# Patient Record
Sex: Male | Born: 1950 | Race: White | Hispanic: No | Marital: Married | Smoking: Former smoker
Health system: Southern US, Community
[De-identification: ages and names within clinical notes are randomized; demographics above are authoritative.]

## PROBLEM LIST (undated history)

## (undated) DIAGNOSIS — M1990 Unspecified osteoarthritis, unspecified site: Secondary | ICD-10-CM

## (undated) DIAGNOSIS — E039 Hypothyroidism, unspecified: Secondary | ICD-10-CM

## (undated) DIAGNOSIS — R600 Localized edema: Secondary | ICD-10-CM

## (undated) DIAGNOSIS — G4733 Obstructive sleep apnea (adult) (pediatric): Secondary | ICD-10-CM

## (undated) DIAGNOSIS — Z9989 Dependence on other enabling machines and devices: Secondary | ICD-10-CM

## (undated) HISTORY — DX: Hypothyroidism, unspecified: E03.9

## (undated) HISTORY — DX: Obstructive sleep apnea (adult) (pediatric): G47.33

## (undated) HISTORY — DX: Dependence on other enabling machines and devices: Z99.89

## (undated) HISTORY — PX: HERNIA REPAIR: SHX51

## (undated) HISTORY — DX: Morbid (severe) obesity due to excess calories: E66.01

## (undated) HISTORY — PX: COLONOSCOPY: SHX174

## (undated) HISTORY — PX: ROTATOR CUFF REPAIR: SHX139

---

## 2003-11-07 ENCOUNTER — Encounter: Admission: RE | Admit: 2003-11-07 | Discharge: 2003-11-07 | Payer: Self-pay | Admitting: *Deleted

## 2004-08-21 ENCOUNTER — Ambulatory Visit (HOSPITAL_COMMUNITY): Admission: RE | Admit: 2004-08-21 | Discharge: 2004-08-21 | Payer: Self-pay | Admitting: Orthopedic Surgery

## 2004-08-21 ENCOUNTER — Ambulatory Visit (HOSPITAL_BASED_OUTPATIENT_CLINIC_OR_DEPARTMENT_OTHER): Admission: RE | Admit: 2004-08-21 | Discharge: 2004-08-21 | Payer: Self-pay | Admitting: Orthopedic Surgery

## 2005-06-26 ENCOUNTER — Encounter: Admission: RE | Admit: 2005-06-26 | Discharge: 2005-07-02 | Payer: Self-pay | Admitting: Family Medicine

## 2007-01-18 ENCOUNTER — Ambulatory Visit: Payer: Self-pay | Admitting: Internal Medicine

## 2010-08-05 ENCOUNTER — Ambulatory Visit (HOSPITAL_COMMUNITY): Admission: RE | Admit: 2010-08-05 | Discharge: 2010-08-05 | Payer: Self-pay | Admitting: Surgery

## 2010-08-06 ENCOUNTER — Ambulatory Visit (HOSPITAL_COMMUNITY): Admission: RE | Admit: 2010-08-06 | Discharge: 2010-08-06 | Payer: Self-pay | Admitting: Surgery

## 2010-08-26 ENCOUNTER — Encounter
Admission: RE | Admit: 2010-08-26 | Discharge: 2010-11-24 | Payer: Self-pay | Source: Home / Self Care | Admitting: Surgery

## 2010-11-03 ENCOUNTER — Ambulatory Visit (HOSPITAL_COMMUNITY): Admission: RE | Admit: 2010-11-03 | Discharge: 2010-11-03 | Payer: Self-pay | Admitting: Surgery

## 2010-12-02 ENCOUNTER — Inpatient Hospital Stay (HOSPITAL_COMMUNITY)
Admission: RE | Admit: 2010-12-02 | Discharge: 2010-12-05 | Payer: Self-pay | Source: Home / Self Care | Attending: Surgery | Admitting: Surgery

## 2010-12-03 ENCOUNTER — Encounter (INDEPENDENT_AMBULATORY_CARE_PROVIDER_SITE_OTHER): Payer: Self-pay | Admitting: Surgery

## 2010-12-09 ENCOUNTER — Encounter
Admission: RE | Admit: 2010-12-09 | Discharge: 2011-01-20 | Payer: Self-pay | Source: Home / Self Care | Attending: Surgery | Admitting: Surgery

## 2010-12-21 HISTORY — PX: GASTRIC BYPASS: SHX52

## 2011-01-26 ENCOUNTER — Encounter: Payer: Medicare PPO | Attending: Surgery | Admitting: *Deleted

## 2011-01-26 ENCOUNTER — Encounter: Admit: 2011-01-26 | Payer: Self-pay | Admitting: Surgery

## 2011-01-26 DIAGNOSIS — Z713 Dietary counseling and surveillance: Secondary | ICD-10-CM | POA: Insufficient documentation

## 2011-01-26 DIAGNOSIS — Z9884 Bariatric surgery status: Secondary | ICD-10-CM | POA: Insufficient documentation

## 2011-01-26 DIAGNOSIS — Z09 Encounter for follow-up examination after completed treatment for conditions other than malignant neoplasm: Secondary | ICD-10-CM | POA: Insufficient documentation

## 2011-03-02 LAB — CBC
HCT: 36.6 % — ABNORMAL LOW (ref 39.0–52.0)
HCT: 38.4 % — ABNORMAL LOW (ref 39.0–52.0)
Hemoglobin: 12.5 g/dL — ABNORMAL LOW (ref 13.0–17.0)
Hemoglobin: 13 g/dL (ref 13.0–17.0)
MCH: 33 pg (ref 26.0–34.0)
MCH: 33.1 pg (ref 26.0–34.0)
MCHC: 33.9 g/dL (ref 30.0–36.0)
MCHC: 34.2 g/dL (ref 30.0–36.0)
MCV: 96.8 fL (ref 78.0–100.0)
MCV: 97.5 fL (ref 78.0–100.0)
Platelets: 193 10*3/uL (ref 150–400)
Platelets: 198 10*3/uL (ref 150–400)
RBC: 3.78 MIL/uL — ABNORMAL LOW (ref 4.22–5.81)
RBC: 3.94 MIL/uL — ABNORMAL LOW (ref 4.22–5.81)
RDW: 13.3 % (ref 11.5–15.5)
RDW: 13.5 % (ref 11.5–15.5)
WBC: 13.1 10*3/uL — ABNORMAL HIGH (ref 4.0–10.5)
WBC: 8.3 10*3/uL (ref 4.0–10.5)

## 2011-03-02 LAB — DIFFERENTIAL
Basophils Absolute: 0 10*3/uL (ref 0.0–0.1)
Basophils Absolute: 0 10*3/uL (ref 0.0–0.1)
Basophils Relative: 0 % (ref 0–1)
Basophils Relative: 0 % (ref 0–1)
Eosinophils Absolute: 0.1 10*3/uL (ref 0.0–0.7)
Eosinophils Absolute: 0.3 10*3/uL (ref 0.0–0.7)
Eosinophils Relative: 0 % (ref 0–5)
Eosinophils Relative: 4 % (ref 0–5)
Lymphocytes Relative: 11 % — ABNORMAL LOW (ref 12–46)
Lymphocytes Relative: 21 % (ref 12–46)
Lymphs Abs: 1.4 10*3/uL (ref 0.7–4.0)
Lymphs Abs: 1.7 10*3/uL (ref 0.7–4.0)
Monocytes Absolute: 1 10*3/uL (ref 0.1–1.0)
Monocytes Absolute: 1.1 10*3/uL — ABNORMAL HIGH (ref 0.1–1.0)
Monocytes Relative: 12 % (ref 3–12)
Monocytes Relative: 8 % (ref 3–12)
Neutro Abs: 10.6 10*3/uL — ABNORMAL HIGH (ref 1.7–7.7)
Neutro Abs: 5.2 10*3/uL (ref 1.7–7.7)
Neutrophils Relative %: 63 % (ref 43–77)
Neutrophils Relative %: 81 % — ABNORMAL HIGH (ref 43–77)

## 2011-03-03 LAB — COMPREHENSIVE METABOLIC PANEL
ALT: 30 U/L (ref 0–53)
AST: 28 U/L (ref 0–37)
Albumin: 3.6 g/dL (ref 3.5–5.2)
Alkaline Phosphatase: 68 U/L (ref 39–117)
BUN: 13 mg/dL (ref 6–23)
CO2: 26 mEq/L (ref 19–32)
Calcium: 9.2 mg/dL (ref 8.4–10.5)
Chloride: 104 mEq/L (ref 96–112)
Creatinine, Ser: 0.8 mg/dL (ref 0.4–1.5)
GFR calc Af Amer: >60 mL/min (ref >60)
GFR calc non Af Amer: >60 mL/min (ref >60)
Glucose, Bld: 79 mg/dL (ref 70–99)
Potassium: 4.3 mEq/L (ref 3.5–5.1)
Sodium: 138 mEq/L (ref 135–145)
Total Bilirubin: 0.8 mg/dL (ref 0.3–1.2)
Total Protein: 6.7 g/dL (ref 6.0–8.3)

## 2011-03-03 LAB — DIFFERENTIAL
Basophils Absolute: 0 10*3/uL (ref 0.0–0.1)
Basophils Absolute: 0 10*3/uL (ref 0.0–0.1)
Basophils Relative: 0 % (ref 0–1)
Basophils Relative: 0 % (ref 0–1)
Eosinophils Absolute: 0 10*3/uL (ref 0.0–0.7)
Eosinophils Absolute: 0.2 10*3/uL (ref 0.0–0.7)
Eosinophils Relative: 0 % (ref 0–5)
Eosinophils Relative: 3 % (ref 0–5)
Lymphocytes Relative: 22 % (ref 12–46)
Lymphocytes Relative: 5 % — ABNORMAL LOW (ref 12–46)
Lymphs Abs: 0.6 10*3/uL — ABNORMAL LOW (ref 0.7–4.0)
Lymphs Abs: 1.7 10*3/uL (ref 0.7–4.0)
Monocytes Absolute: 0.6 10*3/uL (ref 0.1–1.0)
Monocytes Absolute: 0.6 10*3/uL (ref 0.1–1.0)
Monocytes Relative: 5 % (ref 3–12)
Monocytes Relative: 7 % (ref 3–12)
Neutro Abs: 10.5 10*3/uL — ABNORMAL HIGH (ref 1.7–7.7)
Neutro Abs: 5.2 10*3/uL (ref 1.7–7.7)
Neutrophils Relative %: 67 % (ref 43–77)
Neutrophils Relative %: 90 % — ABNORMAL HIGH (ref 43–77)

## 2011-03-03 LAB — CBC
HCT: 37.9 % — ABNORMAL LOW (ref 39.0–52.0)
HCT: 39.4 % (ref 39.0–52.0)
Hemoglobin: 13.3 g/dL (ref 13.0–17.0)
Hemoglobin: 13.7 g/dL (ref 13.0–17.0)
MCH: 32.8 pg (ref 26.0–34.0)
MCH: 33.3 pg (ref 26.0–34.0)
MCHC: 34.8 g/dL (ref 30.0–36.0)
MCHC: 35.1 g/dL (ref 30.0–36.0)
MCV: 93.6 fL (ref 78.0–100.0)
MCV: 95.9 fL (ref 78.0–100.0)
Platelets: 253 10*3/uL (ref 150–400)
Platelets: 259 10*3/uL (ref 150–400)
RBC: 4.05 MIL/uL — ABNORMAL LOW (ref 4.22–5.81)
RBC: 4.11 MIL/uL — ABNORMAL LOW (ref 4.22–5.81)
RDW: 13 % (ref 11.5–15.5)
RDW: 13 % (ref 11.5–15.5)
WBC: 11.6 10*3/uL — ABNORMAL HIGH (ref 4.0–10.5)
WBC: 7.8 10*3/uL (ref 4.0–10.5)

## 2011-03-03 LAB — HEMOGLOBIN AND HEMATOCRIT, BLOOD
HCT: 41 % (ref 39.0–52.0)
HCT: 41.2 % (ref 39.0–52.0)
Hemoglobin: 14.2 g/dL (ref 13.0–17.0)
Hemoglobin: 14.2 g/dL (ref 13.0–17.0)

## 2011-03-03 LAB — SURGICAL PCR SCREEN
MRSA, PCR: NEGATIVE
Staphylococcus aureus: POSITIVE — AB

## 2011-03-16 ENCOUNTER — Encounter: Payer: Medicare PPO | Attending: Surgery | Admitting: *Deleted

## 2011-03-16 DIAGNOSIS — Z9884 Bariatric surgery status: Secondary | ICD-10-CM | POA: Insufficient documentation

## 2011-03-16 DIAGNOSIS — Z713 Dietary counseling and surveillance: Secondary | ICD-10-CM | POA: Insufficient documentation

## 2011-03-16 DIAGNOSIS — Z09 Encounter for follow-up examination after completed treatment for conditions other than malignant neoplasm: Secondary | ICD-10-CM | POA: Insufficient documentation

## 2011-05-08 NOTE — Assessment & Plan Note (Signed)
[facility]                         GASTROENTEROLOGY OFFICE NOTE   NAME:Tony Ho, Tony Ho                      MRN:          [id number]  DATE:01/18/2007                            DOB:          [birth year]    CHIEF COMPLAINT:  Bloating, gas, pain.  Need colonoscopy.   ASSESSMENT:  A 60 year old white man who needs a screening colonoscopy.  He has over the past couple of years developed some periumbilical and  infraumbilical abdominal pain that is vague and mild, associated with  gaseousness, which is generally relieved by flatus.  There is no change  in bowel habits.   PLAN:  1. Proceed to colonoscopy.  2. He was asking about obesity procedures.  I explained that we did      not do that here, but suggested that he call [facility]      Surgery, as he does have morbid obesity, and he might be a      candidate for surgical interventions to treat this problem.   HISTORY:  A 60 year old white man with problems as outlined above.  No  bowel habit changes or rectal bleeding noted.  He recently had a  physical with Dr. Christell Constant.   Family history is negative for colon cancer.  He tells me his Hemoccults  were negative recently.   PAST MEDICAL HISTORY:  1. Morbid obesity.  2. Hypertension.  3. Hypothyroidism.  4. Right and left knee surgery.  5. Right shoulder surgery.   FAMILY HISTORY:  Positive for diabetes and heart disease in his father.   MEDICATIONS:  1. Levoxyl 200 mcg daily.  2. Torsemide 20 mg 3 tabs daily.  3. HCTZ 25 mg daily.   ALLERGIES:  None known.   SOCIAL HISTORY:  He is married.  He is a Chartered certified accountant.  Lives with his  wife.  Three sons.  No alcohol, tobacco, or drugs.   REVIEW OF SYSTEMS:  Eye glasses, pedal edema.  All other systems are  negative.   PHYSICAL EXAMINATION:  VITAL SIGNS:  Height 5 feet 9.  Weight 341  pounds.  Blood pressure 104/60, pulse 78.  GENERAL:  This is a morbidly obese white male in no acute  distress.  HEENT:  Eyes anicteric.  LUNGS:  Clear.  HEART:  S1 and S2.  No murmurs, rubs or gallops.  ABDOMEN:  Obese.  PSYCH:  He is alert and oriented x3.   CBC recently normal on December 08, 2006 with hemoglobin 14.7,  hematocrit 42.  Total cholesterol 204, LFTs normal.  Triglycerides 182,  HDL 39, LDL 129.  Glucose 103.  Renal function normal.  TSH was mildly  up at 9.895.   He actually had 25 mcg added to his dose, so he is really on 225 mcg of  levothyroxine.   I appreciate the opportunity to care for this patient.     Iva Boop, MD,FACG  Electronically Signed    CEG/MedQ  DD: 01/18/2007  DT: 01/18/2007  Job #: [id number]   cc:   Ernestina Penna, M.D.

## 2011-06-01 ENCOUNTER — Encounter (INDEPENDENT_AMBULATORY_CARE_PROVIDER_SITE_OTHER): Payer: Self-pay | Admitting: Surgery

## 2011-06-08 ENCOUNTER — Ambulatory Visit: Payer: Medicare PPO | Admitting: *Deleted

## 2011-06-11 ENCOUNTER — Encounter (INDEPENDENT_AMBULATORY_CARE_PROVIDER_SITE_OTHER): Payer: Self-pay | Admitting: Surgery

## 2011-10-16 ENCOUNTER — Other Ambulatory Visit: Payer: Self-pay | Admitting: Neurology

## 2011-10-16 DIAGNOSIS — R413 Other amnesia: Secondary | ICD-10-CM

## 2011-10-16 DIAGNOSIS — R269 Unspecified abnormalities of gait and mobility: Secondary | ICD-10-CM

## 2011-10-26 ENCOUNTER — Ambulatory Visit
Admission: RE | Admit: 2011-10-26 | Discharge: 2011-10-26 | Disposition: A | Discharge status: Home / Self Care | Payer: Medicare PPO | Source: Ambulatory Visit | Attending: Neurology | Admitting: Neurology

## 2011-10-26 DIAGNOSIS — R413 Other amnesia: Secondary | ICD-10-CM

## 2011-10-26 DIAGNOSIS — R269 Unspecified abnormalities of gait and mobility: Secondary | ICD-10-CM

## 2012-01-23 IMAGING — RF DG UGI W/ GASTROGRAFIN
12 of 21 series · 12 of 21 positions shown · IV contrast (agent unspecified)
Comparison: None

CLINICAL DATA: Postop gastric bypass.

WATER SOLUBLE UPPER GI SERIES
TECHNIQUE: Single-column upper GI series was performed using water
soluble contrast.
Fluoroscopy Time: 1.7 minutes.
Contrast: 20 ml 6mnipaque-KQQ.

[Series 1: run · 1 of 1 slices shown (1 of 12)]
[im 1/1]
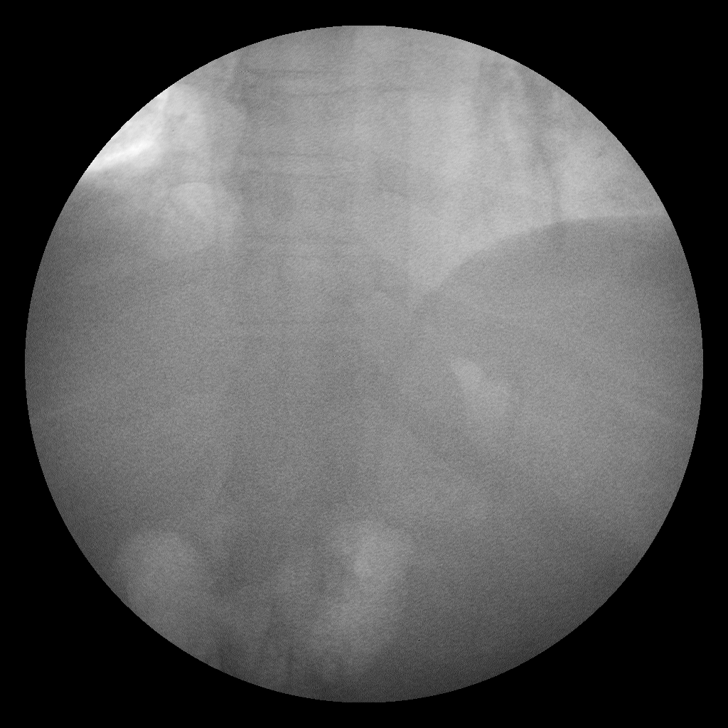

[Series 3: run · 1 of 1 slices shown (2 of 12)]
[im 1/1]
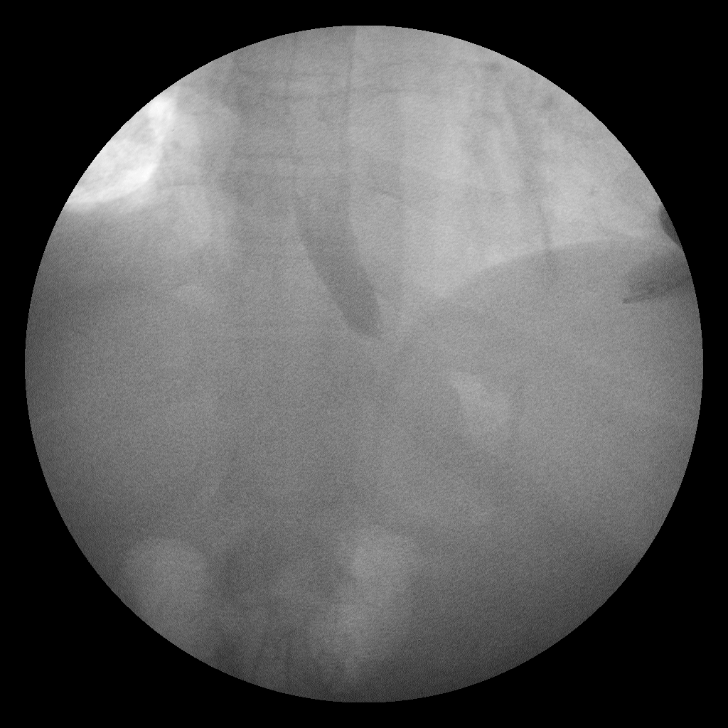

[Series 5: run · 1 of 1 slices shown (3 of 12)]
[im 1/1]
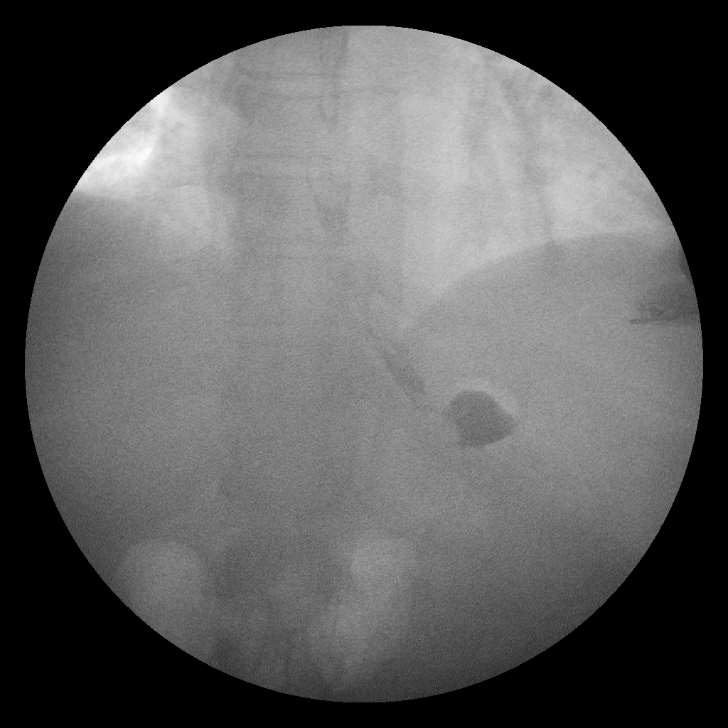

[Series 7: run · 1 of 1 slices shown (4 of 12)]
[im 1/1]
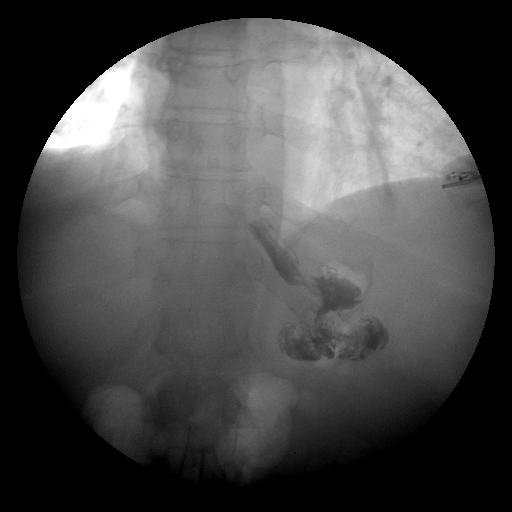

[Series 8: run · 1 of 1 slices shown (5 of 12)]
[im 1/1]
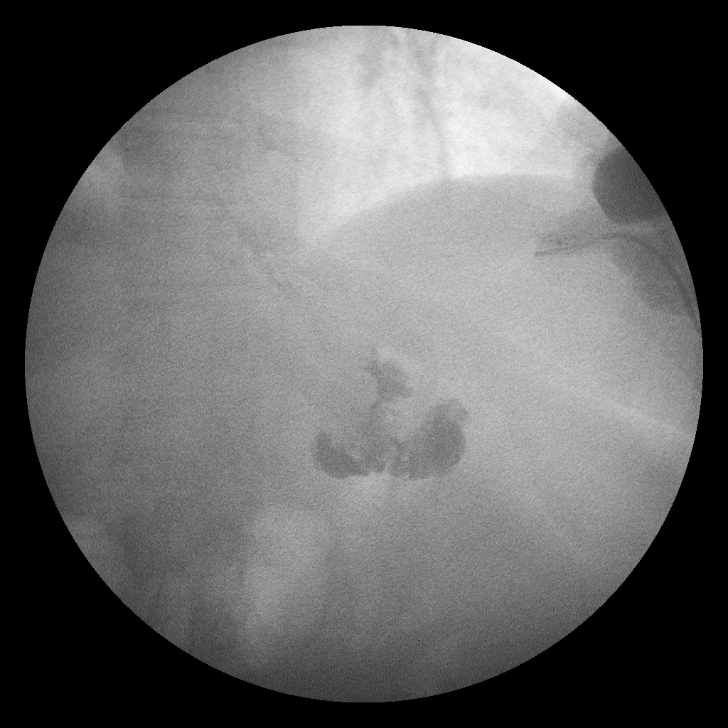

[Series 10: run · 1 of 1 slices shown (6 of 12)]
[im 1/1]
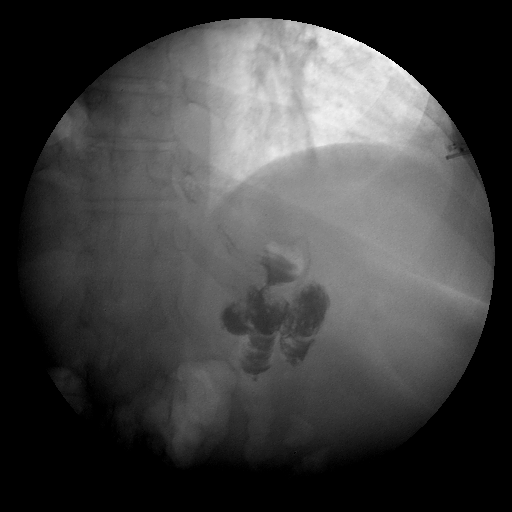

[Series 12: run · 1 of 1 slices shown (7 of 12)]
[im 1/1]
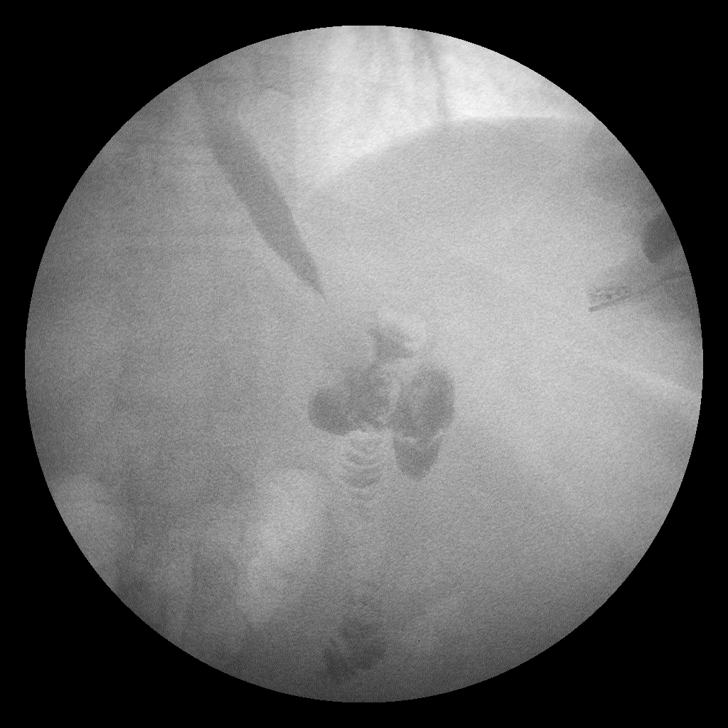

[Series 14: run · 1 of 1 slices shown (8 of 12)]
[im 1/1]
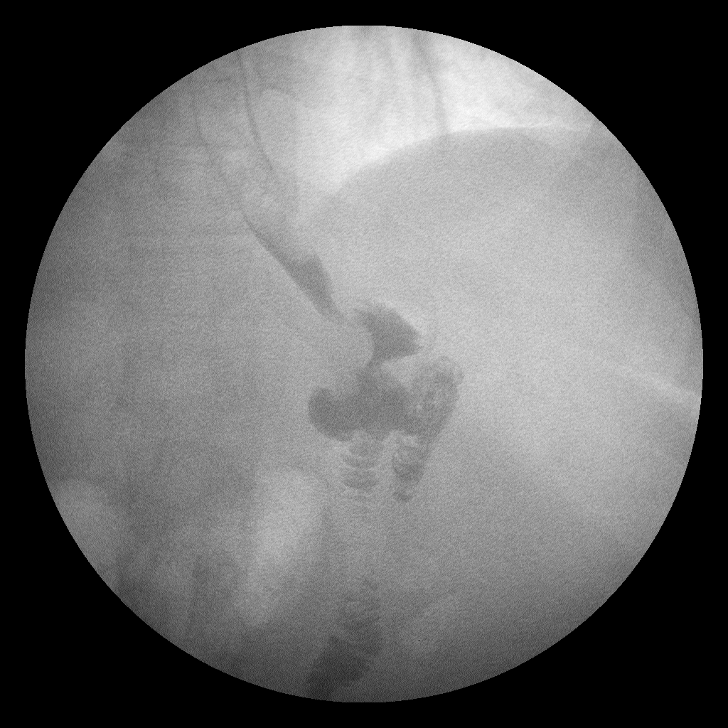

[Series 15: run · 1 of 1 slices shown (9 of 12)]
[im 1/1]
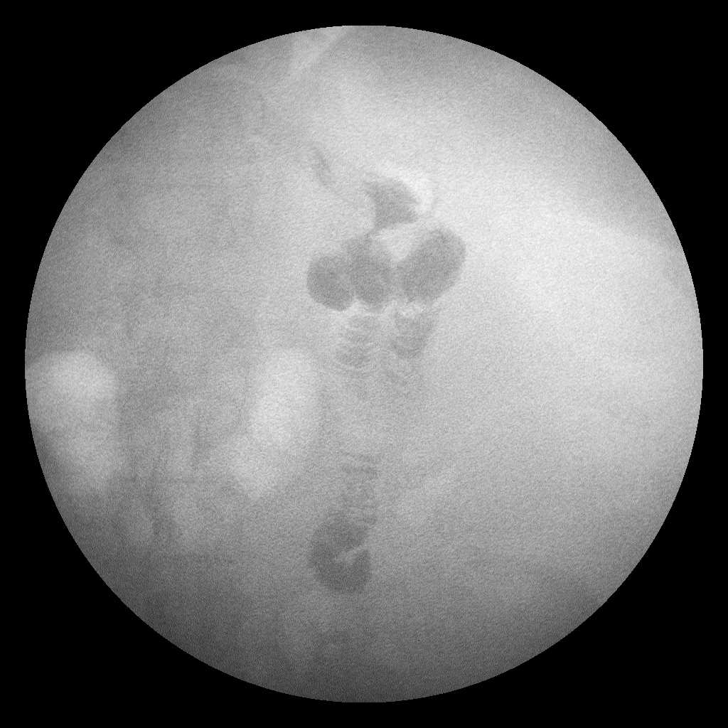

[Series 17: run · 1 of 1 slices shown (10 of 12)]
[im 1/1]
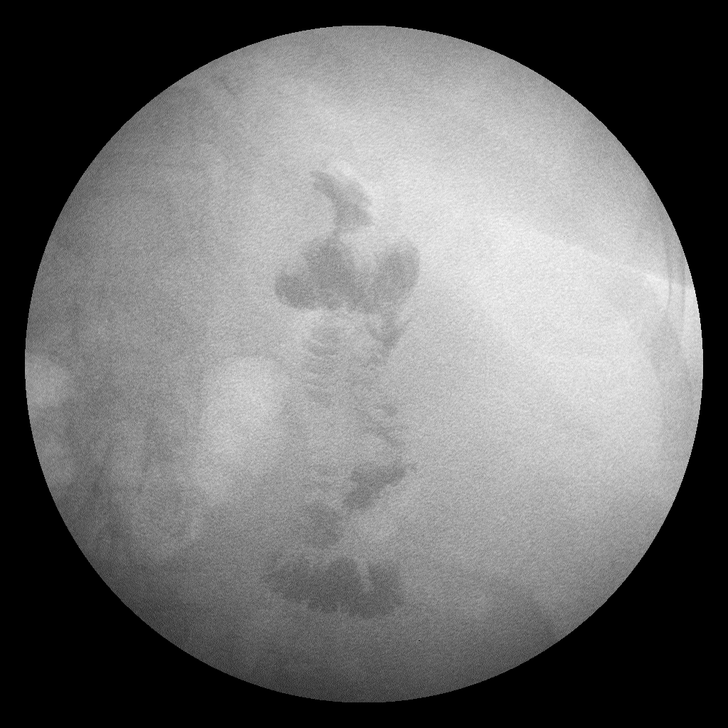

[Series 19: run · 1 of 1 slices shown (11 of 12)]
[im 1/1]
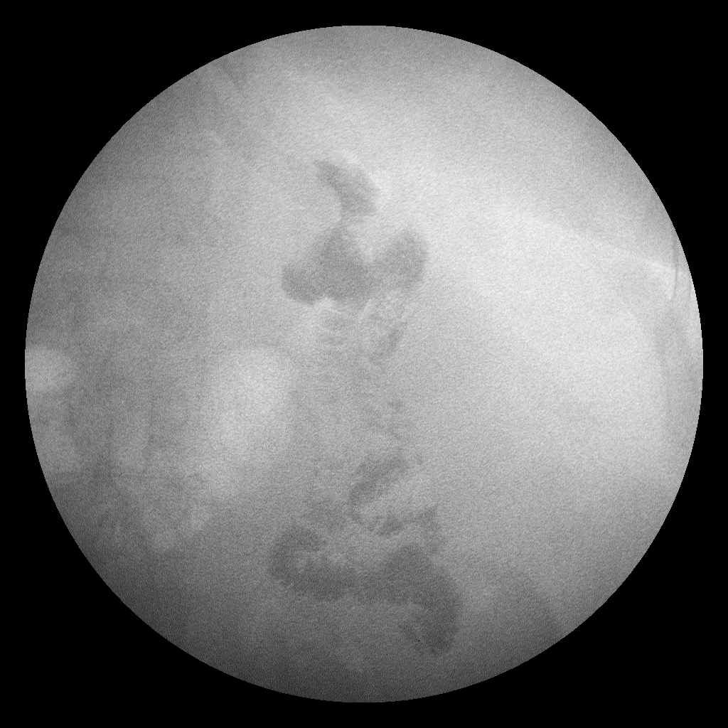

[Series 21: run · 1 of 1 slices shown (12 of 12)]
[im 1/1]
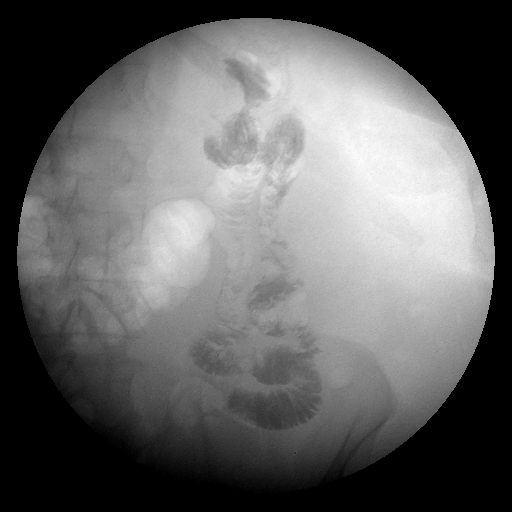

[12 of 21 positions shown; findings below may reference images not displayed]

FINDINGS: Scout film of the abdomen demonstrates mild gaseous
distention of the transverse colon, likely mild ileus.

Ingestion of 20 ml 6mnipaque-KQQ demonstrates normal appearance of
the distal esophagus.  Small gastric pouch is present which freely
passes into the jejunum.  The jejunum is normal caliber.  No
evidence of leak.  Normal passage of contrast through the distal
anastomosis.
IMPRESSION: Expected postop gastric bypass appearance.  No evidence of leak or
obstruction.

## 2012-09-12 ENCOUNTER — Telehealth (INDEPENDENT_AMBULATORY_CARE_PROVIDER_SITE_OTHER): Payer: Self-pay | Admitting: Surgery

## 2012-09-12 NOTE — Telephone Encounter (Signed)
I called the patient to schedule his annual follow-up appointment post gastric bypass surgery. There was no answer at the home phone nor an answering machine to leave a message on..Tony Ho

## 2014-04-25 ENCOUNTER — Telehealth (HOSPITAL_COMMUNITY): Payer: Self-pay

## 2014-04-25 NOTE — Telephone Encounter (Signed)
This patient is overdue for recommended follow-up with a bariatric surgeon at [facility]. Call attempted today to reestablish post-op care with [facility], but unable to reach patient by phone.  A letter will be mailed to the patient today to the address on file from [facility] advising the patient on the benefits of follow-up care and directing them to call [facility] at [phone] to schedule an appointment at their earliest convenience.  ° °Amanda T. Fleming °Bariatric Office Coordinator °[phone] ° °

## 2014-07-04 ENCOUNTER — Encounter: Payer: Self-pay | Admitting: *Deleted

## 2014-07-30 ENCOUNTER — Encounter (INDEPENDENT_AMBULATORY_CARE_PROVIDER_SITE_OTHER): Payer: Self-pay | Admitting: Surgery

## 2014-07-30 ENCOUNTER — Ambulatory Visit (INDEPENDENT_AMBULATORY_CARE_PROVIDER_SITE_OTHER): Payer: Medicare PPO | Admitting: Surgery

## 2014-07-30 VITALS — BP 144/84 | HR 72 | Temp 97.2°F | Ht 67.0 in | Wt 220.0 lb

## 2014-07-30 DIAGNOSIS — L723 Sebaceous cyst: Secondary | ICD-10-CM

## 2014-07-30 DIAGNOSIS — D170 Benign lipomatous neoplasm of skin and subcutaneous tissue of head, face and neck: Secondary | ICD-10-CM | POA: Insufficient documentation

## 2014-07-30 NOTE — Progress Notes (Signed)
Patient ID: Tony Ho, male   DOB: [birth year], 63 y.o.   MRN: [id number]  Chief Complaint  Patient presents with  . Cyst    forehead    HPI Tony Ho is a 63 y.o. male.  Self-referred for forehead mass  HPI This is a 63 year old male Who is status post gastric bypass in 2012 with significant weight loss who presents with an enlarging protruding mass on his for head. He has noticed this for over a year. It seems to be slightly larger. He also has a small palpable subcutaneous mass on his right cheek. He would like to have both of these removed as these occasionally become uncomfortable or, in the case of the cheek mass, caused difficulty with shaving. Past Medical History  Diagnosis Date  . Hypothyroidism   . Morbid obesity   . OSA on CPAP     Past Surgical History  Procedure Laterality Date  . Gastric bypass  2012    Family History  Problem Relation Age of Onset  . Diabetes Mother   . Heart disease Mother     Social History History  Substance Use Topics  . Smoking status: Former Research scientist (life sciences)  . Smokeless tobacco: Never Used     Comment: quit in 1990's  . Alcohol Use: No    No Known Allergies  Current Outpatient Prescriptions  Medication Sig Dispense Refill  . aspirin 325 MG tablet Take 325 mg by mouth daily.      . furosemide (LASIX) 80 MG tablet Take 80 mg by mouth.      . levothyroxine (SYNTHROID, LEVOTHROID) 137 MCG tablet Take 137 mcg by mouth daily before breakfast.      . methocarbamol (ROBAXIN) 500 MG tablet Take 500 mg by mouth 4 (four) times daily.      . temazepam (RESTORIL) 15 MG capsule Take 15 mg by mouth at bedtime as needed for sleep.      . traMADol (ULTRAM) 50 MG tablet Take by mouth every 6 (six) hours as needed.       No current facility-administered medications for this visit.    Review of Systems Review of Systems  Constitutional: Negative for fever, chills and unexpected weight change.  HENT: Negative for congestion, hearing loss, sore  throat, trouble swallowing and voice change.   Eyes: Negative for visual disturbance.  Respiratory: Negative for cough and wheezing.   Cardiovascular: Negative for chest pain, palpitations and leg swelling.  Gastrointestinal: Negative for nausea, vomiting, abdominal pain, diarrhea, constipation, blood in stool, abdominal distention, anal bleeding and rectal pain.  Genitourinary: Negative for hematuria and difficulty urinating.  Musculoskeletal: Negative for arthralgias.  Skin: Negative for rash and wound.  Neurological: Negative for seizures, syncope, weakness and headaches.  Hematological: Negative for adenopathy. Does not bruise/bleed easily.  Psychiatric/Behavioral: Negative for confusion.    Blood pressure 144/84, pulse 72, temperature 97.2 F (36.2 C), height 5\' 7"  (1.702 m), weight 220 lb (99.791 kg).  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  The forehead lipoma will likely cause significant bleeding that would need to be controlled with electrocautery.  The surgical procedure has been discussed with the patient.  Potential risks, benefits, alternative treatments, and expected outcomes have been explained.  All of the patient's questions at this time have been answered.  The likelihood of reaching the patient's treatment goal is good.  The patient understand the proposed surgical procedure and wishes to proceed.         Marco Adelson K. 07/30/2014, 1:20 PM

## 2014-08-10 ENCOUNTER — Encounter (HOSPITAL_COMMUNITY): Payer: Self-pay | Admitting: Pharmacy Technician

## 2014-08-15 ENCOUNTER — Encounter (HOSPITAL_COMMUNITY): Payer: Self-pay

## 2014-08-15 ENCOUNTER — Encounter (HOSPITAL_COMMUNITY)
Admission: RE | Admit: 2014-08-15 | Discharge: 2014-08-15 | Disposition: A | Discharge status: Home / Self Care | Payer: Medicare PPO | Source: Ambulatory Visit | Attending: Surgery | Admitting: Surgery

## 2014-08-15 DIAGNOSIS — E039 Hypothyroidism, unspecified: Secondary | ICD-10-CM | POA: Diagnosis not present

## 2014-08-15 DIAGNOSIS — Z6836 Body mass index (BMI) 36.0-36.9, adult: Secondary | ICD-10-CM | POA: Diagnosis not present

## 2014-08-15 DIAGNOSIS — D2330 Other benign neoplasm of skin of unspecified part of face: Secondary | ICD-10-CM | POA: Diagnosis not present

## 2014-08-15 DIAGNOSIS — D170 Benign lipomatous neoplasm of skin and subcutaneous tissue of head, face and neck: Secondary | ICD-10-CM | POA: Diagnosis not present

## 2014-08-15 DIAGNOSIS — Z9884 Bariatric surgery status: Secondary | ICD-10-CM | POA: Diagnosis not present

## 2014-08-15 DIAGNOSIS — Z87891 Personal history of nicotine dependence: Secondary | ICD-10-CM | POA: Diagnosis not present

## 2014-08-15 DIAGNOSIS — Z7982 Long term (current) use of aspirin: Secondary | ICD-10-CM | POA: Diagnosis not present

## 2014-08-15 DIAGNOSIS — G4733 Obstructive sleep apnea (adult) (pediatric): Secondary | ICD-10-CM | POA: Diagnosis not present

## 2014-08-15 HISTORY — DX: Localized edema: R60.0

## 2014-08-15 HISTORY — DX: Unspecified osteoarthritis, unspecified site: M19.90

## 2014-08-15 LAB — BASIC METABOLIC PANEL
Anion gap: 12 (ref 5–15)
BUN: 11 mg/dL (ref 6–23)
CALCIUM: 8.8 mg/dL (ref 8.4–10.5)
CO2: 27 mEq/L (ref 19–32)
Chloride: 103 mEq/L (ref 96–112)
Creatinine, Ser: 0.73 mg/dL (ref 0.50–1.35)
GFR calc Af Amer: >90 mL/min (ref >90)
GFR calc non Af Amer: >90 mL/min (ref >90)
GLUCOSE: 89 mg/dL (ref 70–99)
POTASSIUM: 4 meq/L (ref 3.7–5.3)
Sodium: 142 mEq/L (ref 137–147)

## 2014-08-15 LAB — CBC
HEMATOCRIT: 38.8 % — AB (ref 39.0–52.0)
HEMOGLOBIN: 13.5 g/dL (ref 13.0–17.0)
MCH: 32.7 pg (ref 26.0–34.0)
MCHC: 34.8 g/dL (ref 30.0–36.0)
MCV: 93.9 fL (ref 78.0–100.0)
Platelets: 208 10*3/uL (ref 150–400)
RBC: 4.13 MIL/uL — ABNORMAL LOW (ref 4.22–5.81)
RDW: 12.4 % (ref 11.5–15.5)
WBC: 5.9 10*3/uL (ref 4.0–10.5)

## 2014-08-15 MED ORDER — CEFAZOLIN SODIUM-DEXTROSE 2-3 GM-% IV SOLR
2.0000 g | INTRAVENOUS | Status: AC
  Administered 2014-08-16: 2 g via INTRAVENOUS
  Filled 2014-08-15: qty 50

## 2014-08-15 NOTE — Progress Notes (Signed)
Pt denies SOB, chest pain, and being under the care of a cardiologist. Pt denies having a cardiac cath but not sure if he had an EKG, echo and stress test; results requested from all treating physicians. Pt denies having a chest x ray within the last year. Pt stated that he takes Lasix for B/LLE edema.

## 2014-08-15 NOTE — Pre-Procedure Instructions (Addendum)
Tony Ho  08/15/2014   Your procedure is scheduled on: Thursday, August 16, 2014   Report to [facility] Admitting at 9:00 AM.  Call this number if you have problems the morning of surgery: [phone]   Remember:   Do not eat food or drink liquids after midnight.   Take these medicines the morning of surgery with A SIP OF WATER: levothyroxine (SYNTHROID), If needed: traMADol (ULTRAM) for pain Stop taking Aspirin, Multivitamins, and herbal medications. Do not take any NSAIDs ie: Ibuprofen, Advil, Naproxen or any medication containing Aspirin.  Do not wear jewelry, make-up or nail polish.  Do not wear lotions, powders, or perfumes. You may wear deodorant.  Do not shave 48 hours prior to surgery. Men may shave face and neck.  Do not bring valuables to the hospital.  [facility] is not responsible for any belongings or valuables.               Contacts, dentures or bridgework may not be worn into surgery.  Leave suitcase in the car. After surgery it may be brought to your room.  For patients admitted to the hospital, discharge time is determined by your treatment team.               Patients discharged the day of surgery will not be allowed to drive home.  Name and phone number of your driver:   Special Instructions:  Special Instructions:Special Instructions: [facility] - Preparing for Surgery  Before surgery, you can play an important role.  Because skin is not sterile, your skin needs to be as free of germs as possible.  You can reduce the number of germs on you skin by washing with CHG (chlorahexidine gluconate) soap before surgery.  CHG is an antiseptic cleaner which kills germs and bonds with the skin to continue killing germs even after washing.  Please DO NOT use if you have an allergy to CHG or antibacterial soaps.  If your skin becomes reddened/irritated stop using the CHG and inform your nurse when you arrive at Short Stay.  Do not shave (including legs and  underarms) for at least 48 hours prior to the first CHG shower.  You may shave your face.  Please follow these instructions carefully:   1.  Shower with CHG Soap the night before surgery and the morning of Surgery.  2.  If you choose to wash your hair, wash your hair first as usual with your normal shampoo.  3.  After you shampoo, rinse your hair and body thoroughly to remove the Shampoo.  4.  Use CHG as you would any other liquid soap.  You can apply chg directly  to the skin and wash gently with scrungie or a clean washcloth.  5.  Apply the CHG Soap to your body ONLY FROM THE NECK DOWN.  Do not use on open wounds or open sores.  Avoid contact with your eyes, ears, mouth and genitals (private parts).  Wash genitals (private parts) with your normal soap.  6.  Wash thoroughly, paying special attention to the area where your surgery will be performed.  7.  Thoroughly rinse your body with warm water from the neck down.  8.  DO NOT shower/wash with your normal soap after using and rinsing off the CHG Soap.  9.  Pat yourself dry with a clean towel.            10.  Wear clean pajamas.  11.  Place clean sheets on your bed the night of your first shower and do not sleep with pets.  Day of Surgery  Do not apply any lotions the morning of surgery.  Please wear clean clothes to the hospital/surgery center.   Please read over the following fact sheets that you were given: Pain Booklet, Coughing and Deep Breathing and Surgical Site Infection Prevention

## 2014-08-16 ENCOUNTER — Encounter (HOSPITAL_COMMUNITY): Payer: Medicare PPO | Admitting: Anesthesiology

## 2014-08-16 ENCOUNTER — Ambulatory Visit (HOSPITAL_COMMUNITY): Payer: Medicare PPO | Admitting: Anesthesiology

## 2014-08-16 ENCOUNTER — Encounter (HOSPITAL_COMMUNITY)
Admission: RE | Disposition: A | Discharge status: Home / Self Care | Payer: Self-pay | Source: Ambulatory Visit | Attending: Surgery

## 2014-08-16 ENCOUNTER — Encounter (HOSPITAL_COMMUNITY): Payer: Self-pay | Admitting: Surgery

## 2014-08-16 ENCOUNTER — Ambulatory Visit (HOSPITAL_COMMUNITY)
Admission: RE | Admit: 2014-08-16 | Discharge: 2014-08-16 | Disposition: A | Discharge status: Home / Self Care | Payer: Medicare PPO | Source: Ambulatory Visit | Attending: Surgery | Admitting: Surgery

## 2014-08-16 DIAGNOSIS — G4733 Obstructive sleep apnea (adult) (pediatric): Secondary | ICD-10-CM | POA: Diagnosis not present

## 2014-08-16 DIAGNOSIS — D1739 Benign lipomatous neoplasm of skin and subcutaneous tissue of other sites: Secondary | ICD-10-CM

## 2014-08-16 DIAGNOSIS — E039 Hypothyroidism, unspecified: Secondary | ICD-10-CM | POA: Insufficient documentation

## 2014-08-16 DIAGNOSIS — D170 Benign lipomatous neoplasm of skin and subcutaneous tissue of head, face and neck: Secondary | ICD-10-CM

## 2014-08-16 DIAGNOSIS — D1039 Benign neoplasm of other parts of mouth: Secondary | ICD-10-CM

## 2014-08-16 DIAGNOSIS — Z9884 Bariatric surgery status: Secondary | ICD-10-CM | POA: Insufficient documentation

## 2014-08-16 DIAGNOSIS — D2330 Other benign neoplasm of skin of unspecified part of face: Secondary | ICD-10-CM | POA: Insufficient documentation

## 2014-08-16 DIAGNOSIS — L723 Sebaceous cyst: Secondary | ICD-10-CM

## 2014-08-16 DIAGNOSIS — Z6836 Body mass index (BMI) 36.0-36.9, adult: Secondary | ICD-10-CM | POA: Insufficient documentation

## 2014-08-16 DIAGNOSIS — Z7982 Long term (current) use of aspirin: Secondary | ICD-10-CM | POA: Insufficient documentation

## 2014-08-16 DIAGNOSIS — Z87891 Personal history of nicotine dependence: Secondary | ICD-10-CM | POA: Insufficient documentation

## 2014-08-16 HISTORY — PX: LIPOMA EXCISION: SHX5283

## 2014-08-16 SURGERY — EXCISION LIPOMA
Anesthesia: General | Site: Face

## 2014-08-16 MED ORDER — MIDAZOLAM HCL 2 MG/2ML IJ SOLN
INTRAMUSCULAR | Status: AC
  Filled 2014-08-16: qty 2

## 2014-08-16 MED ORDER — DEXAMETHASONE SODIUM PHOSPHATE 4 MG/ML IJ SOLN
INTRAMUSCULAR | Status: DC | PRN
  Administered 2014-08-16: 4 mg via INTRAVENOUS

## 2014-08-16 MED ORDER — ONDANSETRON HCL 4 MG/2ML IJ SOLN
INTRAMUSCULAR | Status: DC | PRN
Start: 2014-08-16 — End: 2014-08-16
  Administered 2014-08-16: 4 mg via INTRAVENOUS

## 2014-08-16 MED ORDER — FENTANYL CITRATE 0.05 MG/ML IJ SOLN
INTRAMUSCULAR | Status: AC
  Filled 2014-08-16: qty 5

## 2014-08-16 MED ORDER — PROPOFOL 10 MG/ML IV BOLUS
INTRAVENOUS | Status: DC | PRN
  Administered 2014-08-16: 150 mg via INTRAVENOUS

## 2014-08-16 MED ORDER — HYDROCODONE-ACETAMINOPHEN 5-325 MG PO TABS: 1.0000 | ORAL_TABLET | ORAL | Status: DC | PRN

## 2014-08-16 MED ORDER — CHLORHEXIDINE GLUCONATE 4 % EX LIQD
1.0000 "application " | Freq: Once | CUTANEOUS | Status: DC
  Filled 2014-08-16: qty 15

## 2014-08-16 MED ORDER — FENTANYL CITRATE 0.05 MG/ML IJ SOLN
INTRAMUSCULAR | Status: DC | PRN
  Administered 2014-08-16: 25 ug via INTRAVENOUS
  Administered 2014-08-16: 100 ug via INTRAVENOUS

## 2014-08-16 MED ORDER — LIDOCAINE HCL (CARDIAC) 20 MG/ML IV SOLN
INTRAVENOUS | Status: DC | PRN
  Administered 2014-08-16: 100 mg via INTRAVENOUS

## 2014-08-16 MED ORDER — PROPOFOL 10 MG/ML IV BOLUS
INTRAVENOUS | Status: AC
  Filled 2014-08-16: qty 20

## 2014-08-16 MED ORDER — GLYCOPYRROLATE 0.2 MG/ML IJ SOLN
INTRAMUSCULAR | Status: DC | PRN
  Administered 2014-08-16: 0.2 mg via INTRAVENOUS

## 2014-08-16 MED ORDER — MORPHINE SULFATE 2 MG/ML IJ SOLN: 2.0000 mg | INTRAMUSCULAR | Status: DC | PRN

## 2014-08-16 MED ORDER — ONDANSETRON HCL 4 MG/2ML IJ SOLN
INTRAMUSCULAR | Status: AC
Start: 2014-08-16 — End: 2014-08-16
  Filled 2014-08-16: qty 2

## 2014-08-16 MED ORDER — NEOSTIGMINE METHYLSULFATE 10 MG/10ML IV SOLN
INTRAVENOUS | Status: AC
  Filled 2014-08-16: qty 1

## 2014-08-16 MED ORDER — 0.9 % SODIUM CHLORIDE (POUR BTL) OPTIME
TOPICAL | Status: DC | PRN
  Administered 2014-08-16: 1000 mL

## 2014-08-16 MED ORDER — EPHEDRINE SULFATE 50 MG/ML IJ SOLN
INTRAMUSCULAR | Status: DC | PRN
  Administered 2014-08-16: 10 mg via INTRAVENOUS

## 2014-08-16 MED ORDER — GLYCOPYRROLATE 0.2 MG/ML IJ SOLN
INTRAMUSCULAR | Status: AC
  Filled 2014-08-16: qty 2

## 2014-08-16 MED ORDER — LACTATED RINGERS IV SOLN
INTRAVENOUS | Status: DC | PRN
  Administered 2014-08-16 (×2): via INTRAVENOUS

## 2014-08-16 MED ORDER — ARTIFICIAL TEARS OP OINT
TOPICAL_OINTMENT | OPHTHALMIC | Status: AC
  Filled 2014-08-16: qty 3.5

## 2014-08-16 MED ORDER — ONDANSETRON HCL 4 MG/2ML IJ SOLN: 4.0000 mg | INTRAMUSCULAR | Status: DC | PRN

## 2014-08-16 MED ORDER — BUPIVACAINE-EPINEPHRINE 0.25% -1:200000 IJ SOLN
INTRAMUSCULAR | Status: DC | PRN
  Administered 2014-08-16: 2 mL

## 2014-08-16 MED ORDER — ROCURONIUM BROMIDE 50 MG/5ML IV SOLN
INTRAVENOUS | Status: AC
  Filled 2014-08-16: qty 1

## 2014-08-16 MED ORDER — LACTATED RINGERS IV SOLN
INTRAVENOUS | Status: DC
  Administered 2014-08-16: 09:00:00 via INTRAVENOUS

## 2014-08-16 MED ORDER — MIDAZOLAM HCL 5 MG/5ML IJ SOLN
INTRAMUSCULAR | Status: DC | PRN
  Administered 2014-08-16: 1 mg via INTRAVENOUS

## 2014-08-16 SURGICAL SUPPLY — 53 items
ADH SKN CLS APL DERMABOND .7 (GAUZE/BANDAGES/DRESSINGS) ×1
APL SKNCLS STERI-STRIP NONHPOA (GAUZE/BANDAGES/DRESSINGS) ×1
BENZOIN TINCTURE PRP APPL 2/3 (GAUZE/BANDAGES/DRESSINGS) ×3 IMPLANT
BLADE SURG 15 STRL LF DISP TIS (BLADE) IMPLANT
BLADE SURG 15 STRL SS (BLADE) ×3
BLADE SURG ROTATE 9660 (MISCELLANEOUS) IMPLANT
CHLORAPREP W/TINT 26ML (MISCELLANEOUS) IMPLANT
CLEANER TIP ELECTROSURG 2X2 (MISCELLANEOUS) ×3 IMPLANT
CLOSURE WOUND 1/2 X4 (GAUZE/BANDAGES/DRESSINGS) ×1
COVER SURGICAL LIGHT HANDLE (MISCELLANEOUS) ×3 IMPLANT
DECANTER SPIKE VIAL GLASS SM (MISCELLANEOUS) IMPLANT
DERMABOND ADVANCED (GAUZE/BANDAGES/DRESSINGS) ×2
DERMABOND ADVANCED .7 DNX12 (GAUZE/BANDAGES/DRESSINGS) IMPLANT
DRAPE LAPAROTOMY TRNSV 102X78 (DRAPE) ×3 IMPLANT
DRAPE UTILITY 15X26 W/TAPE STR (DRAPE) ×6 IMPLANT
ELECT CAUTERY BLADE 6.4 (BLADE) ×2 IMPLANT
ELECT REM PT RETURN 9FT ADLT (ELECTROSURGICAL) ×3
ELECTRODE REM PT RTRN 9FT ADLT (ELECTROSURGICAL) ×1 IMPLANT
GAUZE SPONGE 4X4 12PLY STRL (GAUZE/BANDAGES/DRESSINGS) ×3 IMPLANT
GAUZE SPONGE 4X4 16PLY XRAY LF (GAUZE/BANDAGES/DRESSINGS) ×2 IMPLANT
GLOVE BIO SURGEON STRL SZ7 (GLOVE) ×5 IMPLANT
GLOVE BIOGEL PI IND STRL 6 (GLOVE) IMPLANT
GLOVE BIOGEL PI IND STRL 7.0 (GLOVE) IMPLANT
GLOVE BIOGEL PI IND STRL 7.5 (GLOVE) ×1 IMPLANT
GLOVE BIOGEL PI INDICATOR 6 (GLOVE) ×2
GLOVE BIOGEL PI INDICATOR 7.0 (GLOVE) ×4
GLOVE BIOGEL PI INDICATOR 7.5 (GLOVE) ×2
GLOVE SURG SS PI 7.0 STRL IVOR (GLOVE) ×2 IMPLANT
GOWN STRL REUS W/ TWL LRG LVL3 (GOWN DISPOSABLE) ×2 IMPLANT
GOWN STRL REUS W/TWL LRG LVL3 (GOWN DISPOSABLE) ×9
KIT BASIN OR (CUSTOM PROCEDURE TRAY) ×3 IMPLANT
KIT ROOM TURNOVER OR (KITS) ×3 IMPLANT
NDL HYPO 25GX1X1/2 BEV (NEEDLE) IMPLANT
NEEDLE HYPO 25GX1X1/2 BEV (NEEDLE) IMPLANT
NS IRRIG 1000ML POUR BTL (IV SOLUTION) ×3 IMPLANT
PACK SURGICAL SETUP 50X90 (CUSTOM PROCEDURE TRAY) ×3 IMPLANT
PAD ARMBOARD 7.5X6 YLW CONV (MISCELLANEOUS) ×6 IMPLANT
PENCIL BUTTON HOLSTER BLD 10FT (ELECTRODE) ×3 IMPLANT
SPECIMEN JAR SMALL (MISCELLANEOUS) IMPLANT
SPONGE LAP 18X18 X RAY DECT (DISPOSABLE) ×1 IMPLANT
STRIP CLOSURE SKIN 1/2X4 (GAUZE/BANDAGES/DRESSINGS) ×2 IMPLANT
SUT MNCRL AB 4-0 PS2 18 (SUTURE) ×1 IMPLANT
SUT MON AB 5-0 PS2 18 (SUTURE) ×4 IMPLANT
SUT VIC AB 2-0 SH 27 (SUTURE)
SUT VIC AB 2-0 SH 27X BRD (SUTURE) IMPLANT
SUT VIC AB 3-0 SH 27 (SUTURE)
SUT VIC AB 3-0 SH 27XBRD (SUTURE) ×1 IMPLANT
SUT VIC AB 4-0 PS2 27 (SUTURE) IMPLANT
SYR BULB 3OZ (MISCELLANEOUS) ×3 IMPLANT
SYR CONTROL 10ML LL (SYRINGE) ×4 IMPLANT
TOWEL OR 17X24 6PK STRL BLUE (TOWEL DISPOSABLE) ×3 IMPLANT
TOWEL OR 17X26 10 PK STRL BLUE (TOWEL DISPOSABLE) ×3 IMPLANT
WATER STERILE IRR 1000ML POUR (IV SOLUTION) ×3 IMPLANT

## 2014-08-16 NOTE — Transfer of Care (Signed)
Immediate Anesthesia Transfer of Care Note  Patient: Tony Ho  Procedure(s) Performed: Procedure(s): EXCISION OF FOREHEAD LIPOMA AND CYST RIGHT CHEEK (N/A)  Patient Location: PACU  Anesthesia Type:General  Level of Consciousness: awake, alert  and oriented  Airway & Oxygen Therapy: Patient Spontanous Breathing and Patient connected to face mask oxygen  Post-op Assessment: Report given to PACU RN  Post vital signs: Reviewed and stable  Complications: No apparent anesthesia complications

## 2014-08-16 NOTE — Anesthesia Preprocedure Evaluation (Addendum)
Anesthesia Evaluation  Patient identified by MRN, date of birth, ID band Patient awake    Reviewed: Allergy & Precautions, H&P , NPO status , Patient's Chart, lab work & pertinent test results  Airway Mallampati: I TM Distance: >3 FB Neck ROM: Full    Dental   Pulmonary sleep apnea , former smoker,          Cardiovascular     Neuro/Psych    GI/Hepatic   Endo/Other    Renal/GU      Musculoskeletal   Abdominal   Peds  Hematology   Anesthesia Other Findings   Reproductive/Obstetrics                          Anesthesia Physical Anesthesia Plan  ASA: II  Anesthesia Plan: General   Post-op Pain Management:    Induction: Intravenous  Airway Management Planned: LMA  Additional Equipment:   Intra-op Plan:   Post-operative Plan: Extubation in OR  Informed Consent: I have reviewed the patients History and Physical, chart, labs and discussed the procedure including the risks, benefits and alternatives for the proposed anesthesia with the patient or authorized representative who has indicated his/her understanding and acceptance.     Plan Discussed with: CRNA and Surgeon  Anesthesia Plan Comments:         Anesthesia Quick Evaluation

## 2014-08-16 NOTE — Discharge Instructions (Signed)
What to eat: ° °For your first meals, you should eat lightly; only small meals initially.  If you do not have nausea, you may eat larger meals.  Avoid spicy, greasy and heavy food.   ° °General Anesthesia, Adult, Care After  °Refer to this sheet in the next few weeks. These instructions provide you with information on caring for yourself after your procedure. Your health care provider may also give you more specific instructions. Your treatment has been planned according to current medical practices, but problems sometimes occur. Call your health care provider if you have any problems or questions after your procedure.  °WHAT TO EXPECT AFTER THE PROCEDURE  °After the procedure, it is typical to experience:  °Sleepiness.  °Nausea and vomiting. °HOME CARE INSTRUCTIONS  °For the first 24 hours after general anesthesia:  °Have a responsible person with you.  °Do not drive a car. If you are alone, do not take public transportation.  °Do not drink alcohol.  °Do not take medicine that has not been prescribed by your health care provider.  °Do not sign important papers or make important decisions.  °You may resume a normal diet and activities as directed by your health care provider.  °Change bandages (dressings) as directed.  °If you have questions or problems that seem related to general anesthesia, call the hospital and ask for the anesthetist or anesthesiologist on call. °SEEK MEDICAL CARE IF:  °You have nausea and vomiting that continue the day after anesthesia.  °You develop a rash. °SEEK IMMEDIATE MEDICAL CARE IF:  °You have difficulty breathing.  °You have chest pain.  °You have any allergic problems. °Document Released: 03/15/2001 Document Revised: 08/09/2013 Document Reviewed: 06/22/2013  °ExitCare® Patient Information ©2014 [employer].  ° °Tissue Adhesive Wound Care  ° ° °Some cuts and wounds can be closed with tissue adhesive. Adhesive is like glue. It holds the skin together and helps a wound heal faster.  This adhesive goes away on its own as the wound heals.  °HOME CARE  °Showers are allowed. Do not soak the wound in water. Do not take baths, swim, or use hot tubs. Do not use soaps or creams on your wound.  °If a bandage (dressing) was put on, change it as often as told by your doctor.  °Keep the bandage dry.  °Do not scratch, pick, or rub the adhesive.  °Do not put tape over the adhesive. The adhesive could come off.  °Protect the wound from another injury.  °Protect the wound from sun and tanning beds.  °Only take medicine as told by your doctor.  °Keep all doctor visits as told. °GET HELP RIGHT AWAY IF:  °Your wound is red, puffy (swollen), hot, or tender.  °You get a rash after the glue is put on.  °You have more pain in the wound.  °You have a red streak going away from the wound.  °You have yellowish-white fluid (pus) coming from the wound.  °You have more bleeding.  °You have a fever.  °You have chills and start to shake.  °You notice a bad smell coming from the wound.  °Your wound or adhesive breaks open. °MAKE SURE YOU:  °Understand these instructions.  °Will watch your condition.  °Will get help right away if you are not doing well or get worse. °Document Released: 09/15/2008 Document Revised: 09/27/2013 Document Reviewed: 06/28/2013  °ExitCare® Patient Information ©2015 [employer]. This information is not intended to replace advice given to you by your health care provider.   Make sure you discuss any questions you have with your health care provider.  ° ° °

## 2014-08-16 NOTE — H&P (View-Only) (Signed)
Patient ID: Tony Ho, male   DOB: 1951/03/24, 63 y.o.   MRN: [id number]  Chief Complaint  Patient presents with  . Cyst    forehead    HPI Tony Ho is a 63 y.o. male.  Self-referred for forehead mass  HPI This is a 63 year old male Who is status post gastric bypass in 2012 with significant weight loss who presents with an enlarging protruding mass on his for head. He has noticed this for over a year. It seems to be slightly larger. He also has a small palpable subcutaneous mass on his right cheek. He would like to have both of these removed as these occasionally become uncomfortable or, in the case of the cheek mass, caused difficulty with shaving. Past Medical History  Diagnosis Date  . Hypothyroidism   . Morbid obesity   . OSA on CPAP     Past Surgical History  Procedure Laterality Date  . Gastric bypass  2012    Family History  Problem Relation Age of Onset  . Diabetes Mother   . Heart disease Mother     Social History History  Substance Use Topics  . Smoking status: Former Research scientist (life sciences)  . Smokeless tobacco: Never Used     Comment: quit in 1990's  . Alcohol Use: No    No Known Allergies  Current Outpatient Prescriptions  Medication Sig Dispense Refill  . aspirin 325 MG tablet Take 325 mg by mouth daily.      . furosemide (LASIX) 80 MG tablet Take 80 mg by mouth.      . levothyroxine (SYNTHROID, LEVOTHROID) 137 MCG tablet Take 137 mcg by mouth daily before breakfast.      . methocarbamol (ROBAXIN) 500 MG tablet Take 500 mg by mouth 4 (four) times daily.      . temazepam (RESTORIL) 15 MG capsule Take 15 mg by mouth at bedtime as needed for sleep.      . traMADol (ULTRAM) 50 MG tablet Take by mouth every 6 (six) hours as needed.       No current facility-administered medications for this visit.    Review of Systems Review of Systems  Constitutional: Negative for fever, chills and unexpected weight change.  HENT: Negative for congestion, hearing loss, sore  throat, trouble swallowing and voice change.   Eyes: Negative for visual disturbance.  Respiratory: Negative for cough and wheezing.   Cardiovascular: Negative for chest pain, palpitations and leg swelling.  Gastrointestinal: Negative for nausea, vomiting, abdominal pain, diarrhea, constipation, blood in stool, abdominal distention, anal bleeding and rectal pain.  Genitourinary: Negative for hematuria and difficulty urinating.  Musculoskeletal: Negative for arthralgias.  Skin: Negative for rash and wound.  Neurological: Negative for seizures, syncope, weakness and headaches.  Hematological: Negative for adenopathy. Does not bruise/bleed easily.  Psychiatric/Behavioral: Negative for confusion.    Blood pressure 144/84, pulse 72, temperature 97.2 F (36.2 C), height 5\' 7"  (1.702 m), weight 220 lb (99.791 kg).  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  The forehead lipoma will likely cause significant bleeding that would need to be controlled with electrocautery.  The surgical procedure has been discussed with the patient.  Potential risks, benefits, alternative treatments, and expected outcomes have been explained.  All of the patient's questions at this time have been answered.  The likelihood of reaching the patient's treatment goal is good.  The patient understand the proposed surgical procedure and wishes to proceed.         Ellyse Rotolo K. 07/30/2014, 1:20 PM

## 2014-08-16 NOTE — Interval H&P Note (Signed)
History and Physical Interval Note:  08/16/2014 8:46 AM  Tony Ho  has presented today for surgery, with the diagnosis of Forehead Lipoma  The various methods of treatment have been discussed with the patient and family. After consideration of risks, benefits and other options for treatment, the patient has consented to  Procedure(s): EXCISION OF FOREHEAD LIPOMA AND CYST RIGHT CHEEK (N/A) as a surgical intervention .  The patient's history has been reviewed, patient examined, no change in status, stable for surgery.  I have reviewed the patient's chart and labs.  Questions were answered to the patient's satisfaction.     Geofrey Silliman K.

## 2014-08-16 NOTE — Anesthesia Postprocedure Evaluation (Signed)
  Anesthesia Post-op Note  Patient: Tony Ho  Procedure(s) Performed: Procedure(s): EXCISION OF FOREHEAD LIPOMA AND CYST RIGHT CHEEK (N/A)  Patient Location: PACU  Anesthesia Type:General  Level of Consciousness: awake, alert , oriented and patient cooperative  Airway and Oxygen Therapy: Patient Spontanous Breathing  Post-op Pain: none  Post-op Assessment: Post-op Vital signs reviewed, Patient's Cardiovascular Status Stable, Respiratory Function Stable, Patent Airway, No signs of Nausea or vomiting and Pain level controlled  Post-op Vital Signs: Reviewed and stable  Last Vitals:  Filed Vitals:   08/16/14 1201  BP: 125/56  Pulse: 53  Temp:   Resp: 17    Complications: No apparent anesthesia complications

## 2014-08-16 NOTE — Interval H&P Note (Signed)
History and Physical Interval Note:  08/16/2014 8:46 AM  Tony Ho  has presented today for surgery, with the diagnosis of Forehead Lipoma  The various methods of treatment have been discussed with the patient and family. After consideration of risks, benefits and other options for treatment, the patient has consented to  Procedure(s): EXCISION OF FOREHEAD LIPOMA AND CYST RIGHT CHEEK (N/A) as a surgical intervention .  The patient's history has been reviewed, patient examined, no change in status, stable for surgery.  I have reviewed the patient's chart and labs.  Questions were answered to the patient's satisfaction.     [name] K.

## 2014-08-16 NOTE — Op Note (Signed)
Preop diagnosis: #1 subcutaneous lipoma of the forehead (1.5 cm) #2 subcutaneous mass right cheek (1 cm)  Postop diagnosis: Same Procedure: Excision of subcutaneous lipoma the forehead and subcutaneous mass of the right cheek Surgeon:Lanae Federer K. Anesthesia Gen. Via LMA Indications: This is a 63 year old male who presents with gradually enlarging, uncomfortable subcutaneous mass of the forehead as well as an enlarging mass of the right cheek. The mass on the forehead seems to be a lipoma And the mass on the cheek may be a sebaceous cyst. He presents now for excision of both these.  Description of procedure: The patient was brought to the operating room and placed in a supine position on the operating room table. After an adequate level of general anesthesia was obtained the area of his forehead and right cheek Was prepped with Betadine and draped in sterile fashion. A timeout was taken to ensure the proper patient and proper procedure. We infiltrated the area over each of these masses with 0.25% Marcaine with epinephrine. I made a 1.5 cm transverse incision over the lipoma. Dissection was carried down to the surface the lipoma. This was dissected free from the underlying fascia. We removed the mass in its entirety. The wound was closed with a deep layer of 4-0 Vicryl and a subcuticular layer of 5-0 Monocryl. We then excised the right cheek mass through a 1 cm incision. This was completely excised and sent for pathologic examination. We closed the wound with 5-0 Monocryl. Dermabond was used to dress both incisions. The patient was then extubated and brought to the recovery room in stable condition. All sponge, initially, and needle counts are correct.  Imogene Burn. Georgette Dover, MD, [facility]  General/ Trauma Surgery  08/16/2014 11:43 AM

## 2014-08-17 ENCOUNTER — Encounter (HOSPITAL_COMMUNITY): Payer: Self-pay | Admitting: Surgery

## 2016-08-14 NOTE — H&P (Signed)
  Tony Ho is an 65 y.o. male.    Chief Complaint: left knee pain  HPI: Pt is a 65 y.o. male complaining of left knee pain for multiple years. Pain had continually increased since the beginning. X-rays in the clinic show end-stage arthritic changes of the left knee. Pt has tried various conservative treatments which have failed to alleviate their symptoms, including injections and therapy. Various options are discussed with the patient. Risks, benefits and expectations were discussed with the patient. Patient understand the risks, benefits and expectations and wishes to proceed with surgery.   PCP:  No primary care provider on file.  D/C Plans: Home  PMH: Past Medical History:  Diagnosis Date  . Arthritis   . Edema extremities    B/LLE  . Hypothyroidism   . Morbid obesity   . OSA on CPAP     PSH: Past Surgical History:  Procedure Laterality Date  . COLONOSCOPY    . GASTRIC BYPASS  2012  . HERNIA REPAIR    . LIPOMA EXCISION N/A 08/16/2014   Procedure: EXCISION OF FOREHEAD LIPOMA AND CYST RIGHT CHEEK;  Surgeon: Tony Burn. Georgette Dover, MD;  Location: [facility];  Service: General;  Laterality: N/A;    Social History:  reports that he has quit smoking. He has quit using smokeless tobacco. His smokeless tobacco use included Chew. He reports that he does not drink alcohol or use drugs.  Allergies:  No Known Allergies  Medications: No current facility-administered medications for this encounter.    Current Outpatient Prescriptions  Medication Sig Dispense Refill  . aspirin 325 MG tablet Take 325 mg by mouth daily.    . calcium carbonate (OS-CAL) 1250 MG chewable tablet Chew 1 tablet by mouth daily.    . furosemide (LASIX) 80 MG tablet Take 80 mg by mouth.    . levothyroxine (SYNTHROID, LEVOTHROID) 175 MCG tablet Take 175 mcg by mouth daily.  0  . Multiple Vitamin (MULTIVITAMIN WITH MINERALS) TABS tablet Take 1 tablet by mouth daily.    . temazepam (RESTORIL) 15 MG capsule Take 15 mg  by mouth at bedtime as needed for sleep.    . traMADol (ULTRAM) 50 MG tablet Take 50 mg by mouth every 6 (six) hours as needed (pain).       No results found for this or any previous visit (from the past 48 hour(s)). No results found.  ROS: Pain with rom of the left lower extremity  Physical Exam:  Alert and oriented 65 y.o. male in no acute distress Cranial nerves 2-12 intact Cervical spine: full rom with no tenderness, nv intact distally Chest: active breath sounds bilaterally, no wheeze rhonchi or rales Heart: regular rate and rhythm, no murmur Abd: non tender non distended with active bowel sounds Hip is stable with rom  Left knee with moderate medial and lateral joint line tenderness Crepitus with rom nv intact distally Antalgic gait  Assessment/Plan Assessment: left knee end stage osteoarthritis  Plan: Patient will undergo a left total knee by Dr. Veverly Fells at [facility]. Risks benefits and expectations were discussed with the patient. Patient understand risks, benefits and expectations and wishes to proceed.

## 2016-08-18 ENCOUNTER — Encounter (HOSPITAL_COMMUNITY): Payer: Self-pay

## 2016-08-18 ENCOUNTER — Encounter (HOSPITAL_COMMUNITY)
Admission: RE | Admit: 2016-08-18 | Discharge: 2016-08-18 | Disposition: A | Discharge status: Home / Self Care | Payer: Medicare PPO | Source: Ambulatory Visit | Attending: Orthopedic Surgery | Admitting: Orthopedic Surgery

## 2016-08-18 DIAGNOSIS — Z01812 Encounter for preprocedural laboratory examination: Secondary | ICD-10-CM | POA: Insufficient documentation

## 2016-08-18 DIAGNOSIS — Z01818 Encounter for other preprocedural examination: Secondary | ICD-10-CM | POA: Diagnosis not present

## 2016-08-18 DIAGNOSIS — M1712 Unilateral primary osteoarthritis, left knee: Secondary | ICD-10-CM | POA: Insufficient documentation

## 2016-08-18 LAB — CBC
HEMATOCRIT: 40.8 % (ref 39.0–52.0)
Hemoglobin: 13.6 g/dL (ref 13.0–17.0)
MCH: 32.6 pg (ref 26.0–34.0)
MCHC: 33.3 g/dL (ref 30.0–36.0)
MCV: 97.8 fL (ref 78.0–100.0)
Platelets: 204 10*3/uL (ref 150–400)
RBC: 4.17 MIL/uL — ABNORMAL LOW (ref 4.22–5.81)
RDW: 12.6 % (ref 11.5–15.5)
WBC: 7.2 10*3/uL (ref 4.0–10.5)

## 2016-08-18 LAB — SURGICAL PCR SCREEN
MRSA, PCR: NEGATIVE
STAPHYLOCOCCUS AUREUS: NEGATIVE

## 2016-08-18 LAB — BASIC METABOLIC PANEL
Anion gap: 7 (ref 5–15)
BUN: 10 mg/dL (ref 6–20)
CALCIUM: 9 mg/dL (ref 8.9–10.3)
CO2: 26 mmol/L (ref 22–32)
CREATININE: 0.69 mg/dL (ref 0.61–1.24)
Chloride: 106 mmol/L (ref 101–111)
GFR calc non Af Amer: >60 mL/min (ref >60)
Glucose, Bld: 83 mg/dL (ref 65–99)
Potassium: 3.9 mmol/L (ref 3.5–5.1)
Sodium: 139 mmol/L (ref 135–145)

## 2016-08-27 MED ORDER — CEFAZOLIN SODIUM-DEXTROSE 2-4 GM/100ML-% IV SOLN
2.0000 g | INTRAVENOUS | Status: AC
  Administered 2016-08-28: 2 g via INTRAVENOUS
  Filled 2016-08-27: qty 100

## 2016-08-28 ENCOUNTER — Inpatient Hospital Stay (HOSPITAL_COMMUNITY): Payer: Medicare PPO

## 2016-08-28 ENCOUNTER — Encounter (HOSPITAL_COMMUNITY): Payer: Self-pay | Admitting: *Deleted

## 2016-08-28 ENCOUNTER — Inpatient Hospital Stay (HOSPITAL_COMMUNITY): Payer: Medicare PPO | Admitting: Certified Registered"

## 2016-08-28 ENCOUNTER — Inpatient Hospital Stay (HOSPITAL_COMMUNITY)
Admission: RE | Admit: 2016-08-28 | Discharge: 2016-08-30 | DRG: 470 | Disposition: A | Discharge status: Home Health | Payer: Medicare PPO | Source: Ambulatory Visit | Attending: Orthopedic Surgery | Admitting: Orthopedic Surgery

## 2016-08-28 ENCOUNTER — Encounter (HOSPITAL_COMMUNITY)
Admission: RE | Disposition: A | Discharge status: Home Health | Payer: Self-pay | Source: Ambulatory Visit | Attending: Orthopedic Surgery

## 2016-08-28 DIAGNOSIS — Z9884 Bariatric surgery status: Secondary | ICD-10-CM

## 2016-08-28 DIAGNOSIS — M1712 Unilateral primary osteoarthritis, left knee: Principal | ICD-10-CM | POA: Diagnosis present

## 2016-08-28 DIAGNOSIS — Z96659 Presence of unspecified artificial knee joint: Secondary | ICD-10-CM

## 2016-08-28 DIAGNOSIS — E039 Hypothyroidism, unspecified: Secondary | ICD-10-CM | POA: Diagnosis present

## 2016-08-28 DIAGNOSIS — G4733 Obstructive sleep apnea (adult) (pediatric): Secondary | ICD-10-CM | POA: Diagnosis present

## 2016-08-28 DIAGNOSIS — Z7982 Long term (current) use of aspirin: Secondary | ICD-10-CM

## 2016-08-28 DIAGNOSIS — M25562 Pain in left knee: Secondary | ICD-10-CM | POA: Diagnosis present

## 2016-08-28 DIAGNOSIS — Z87891 Personal history of nicotine dependence: Secondary | ICD-10-CM | POA: Diagnosis not present

## 2016-08-28 DIAGNOSIS — Z6834 Body mass index (BMI) 34.0-34.9, adult: Secondary | ICD-10-CM

## 2016-08-28 DIAGNOSIS — Z79899 Other long term (current) drug therapy: Secondary | ICD-10-CM

## 2016-08-28 DIAGNOSIS — Z96652 Presence of left artificial knee joint: Secondary | ICD-10-CM

## 2016-08-28 HISTORY — PX: TOTAL KNEE ARTHROPLASTY: SHX125

## 2016-08-28 SURGERY — ARTHROPLASTY, KNEE, TOTAL
Anesthesia: Regional | Site: Knee | Laterality: Left

## 2016-08-28 MED ORDER — PROPOFOL 500 MG/50ML IV EMUL
INTRAVENOUS | Status: DC | PRN
  Administered 2016-08-28: 100 ug/kg/min via INTRAVENOUS

## 2016-08-28 MED ORDER — PROPOFOL 10 MG/ML IV BOLUS
INTRAVENOUS | Status: AC
  Filled 2016-08-28: qty 20

## 2016-08-28 MED ORDER — CEFAZOLIN SODIUM-DEXTROSE 2-4 GM/100ML-% IV SOLN
2.0000 g | Freq: Four times a day (QID) | INTRAVENOUS | Status: AC
  Administered 2016-08-28 – 2016-08-29 (×2): 2 g via INTRAVENOUS
  Filled 2016-08-28 (×2): qty 100

## 2016-08-28 MED ORDER — OXYCODONE HCL 5 MG PO TABS
ORAL_TABLET | ORAL | Status: AC
  Administered 2016-08-29: 10 mg via ORAL
  Filled 2016-08-28: qty 2

## 2016-08-28 MED ORDER — GLYCOPYRROLATE 0.2 MG/ML IV SOSY
PREFILLED_SYRINGE | INTRAVENOUS | Status: AC
  Filled 2016-08-28: qty 6

## 2016-08-28 MED ORDER — FENTANYL CITRATE (PF) 100 MCG/2ML IJ SOLN
50.0000 ug | Freq: Once | INTRAMUSCULAR | Status: AC
  Administered 2016-08-28: 50 ug via INTRAVENOUS

## 2016-08-28 MED ORDER — HYDROMORPHONE HCL 1 MG/ML IJ SOLN
0.5000 mg | INTRAMUSCULAR | Status: DC | PRN
  Administered 2016-08-28 – 2016-08-30 (×8): 1 mg via INTRAVENOUS
  Filled 2016-08-28 (×8): qty 1

## 2016-08-28 MED ORDER — METHOCARBAMOL 1000 MG/10ML IJ SOLN
500.0000 mg | Freq: Four times a day (QID) | INTRAVENOUS | Status: DC | PRN
  Filled 2016-08-28: qty 5

## 2016-08-28 MED ORDER — FENTANYL CITRATE (PF) 100 MCG/2ML IJ SOLN
INTRAMUSCULAR | Status: AC
  Filled 2016-08-28: qty 2

## 2016-08-28 MED ORDER — SODIUM CHLORIDE 0.9 % IV SOLN
INTRAVENOUS | Status: DC
  Administered 2016-08-28: 17:00:00 via INTRAVENOUS

## 2016-08-28 MED ORDER — DOCUSATE SODIUM 100 MG PO CAPS
100.0000 mg | ORAL_CAPSULE | Freq: Two times a day (BID) | ORAL | Status: DC
  Administered 2016-08-29 – 2016-08-30 (×4): 100 mg via ORAL
  Filled 2016-08-28 (×4): qty 1

## 2016-08-28 MED ORDER — METOCLOPRAMIDE HCL 5 MG/ML IJ SOLN: 5.0000 mg | Freq: Three times a day (TID) | INTRAMUSCULAR | Status: DC | PRN

## 2016-08-28 MED ORDER — ONDANSETRON HCL 4 MG/2ML IJ SOLN: 4.0000 mg | Freq: Four times a day (QID) | INTRAMUSCULAR | Status: DC | PRN

## 2016-08-28 MED ORDER — LEVOTHYROXINE SODIUM 75 MCG PO TABS
175.0000 ug | ORAL_TABLET | Freq: Every day | ORAL | Status: DC
  Administered 2016-08-29 – 2016-08-30 (×2): 175 ug via ORAL
  Filled 2016-08-28 (×2): qty 1

## 2016-08-28 MED ORDER — PHENYLEPHRINE HCL 10 MG/ML IJ SOLN
INTRAVENOUS | Status: DC | PRN
  Administered 2016-08-28: 25 ug/min via INTRAVENOUS

## 2016-08-28 MED ORDER — BISACODYL 10 MG RE SUPP: 10.0000 mg | Freq: Every day | RECTAL | Status: DC | PRN

## 2016-08-28 MED ORDER — TRAMADOL HCL 50 MG PO TABS: 50.0000 mg | ORAL_TABLET | Freq: Four times a day (QID) | ORAL | Status: DC | PRN

## 2016-08-28 MED ORDER — ENOXAPARIN SODIUM 30 MG/0.3ML ~~LOC~~ SOLN
30.0000 mg | Freq: Two times a day (BID) | SUBCUTANEOUS | Status: DC
  Administered 2016-08-29 – 2016-08-30 (×3): 30 mg via SUBCUTANEOUS
  Filled 2016-08-28 (×3): qty 0.3

## 2016-08-28 MED ORDER — LACTATED RINGERS IV SOLN
INTRAVENOUS | Status: DC
  Administered 2016-08-28 (×2): via INTRAVENOUS

## 2016-08-28 MED ORDER — SODIUM CHLORIDE 0.9 % IV SOLN
1000.0000 mg | INTRAVENOUS | Status: AC
  Administered 2016-08-28: 1000 mg via INTRAVENOUS
  Filled 2016-08-28: qty 10

## 2016-08-28 MED ORDER — ACETAMINOPHEN 325 MG PO TABS: 650.0000 mg | ORAL_TABLET | Freq: Four times a day (QID) | ORAL | Status: DC | PRN

## 2016-08-28 MED ORDER — OXYCODONE HCL 5 MG PO TABS
5.0000 mg | ORAL_TABLET | ORAL | Status: DC | PRN
  Administered 2016-08-28 – 2016-08-29 (×3): 10 mg via ORAL
  Filled 2016-08-28 (×2): qty 2

## 2016-08-28 MED ORDER — BUPIVACAINE-EPINEPHRINE (PF) 0.5% -1:200000 IJ SOLN
INTRAMUSCULAR | Status: DC | PRN
  Administered 2016-08-28: 25 mL

## 2016-08-28 MED ORDER — METHOCARBAMOL 500 MG PO TABS
ORAL_TABLET | ORAL | Status: AC
  Administered 2016-08-29: 500 mg via ORAL
  Filled 2016-08-28: qty 1

## 2016-08-28 MED ORDER — FUROSEMIDE 40 MG PO TABS
80.0000 mg | ORAL_TABLET | Freq: Every day | ORAL | Status: DC
  Filled 2016-08-28: qty 2

## 2016-08-28 MED ORDER — LIDOCAINE 2% (20 MG/ML) 5 ML SYRINGE
INTRAMUSCULAR | Status: AC
  Filled 2016-08-28: qty 5

## 2016-08-28 MED ORDER — ASPIRIN 325 MG PO TABS
325.0000 mg | ORAL_TABLET | Freq: Every day | ORAL | Status: DC
  Administered 2016-08-29 – 2016-08-30 (×2): 325 mg via ORAL
  Filled 2016-08-28 (×2): qty 1

## 2016-08-28 MED ORDER — WARFARIN SODIUM 7.5 MG PO TABS
7.5000 mg | ORAL_TABLET | ORAL | Status: AC
  Administered 2016-08-28: 7.5 mg via ORAL
  Filled 2016-08-28: qty 1

## 2016-08-28 MED ORDER — LIDOCAINE 2% (20 MG/ML) 5 ML SYRINGE
INTRAMUSCULAR | Status: DC | PRN
  Administered 2016-08-28 (×2): 20 mg via INTRAVENOUS

## 2016-08-28 MED ORDER — FENTANYL CITRATE (PF) 100 MCG/2ML IJ SOLN
INTRAMUSCULAR | Status: AC
  Administered 2016-08-28: 50 ug via INTRAVENOUS
  Filled 2016-08-28: qty 2

## 2016-08-28 MED ORDER — FENTANYL CITRATE (PF) 100 MCG/2ML IJ SOLN
INTRAMUSCULAR | Status: DC | PRN
  Administered 2016-08-28 (×2): 50 ug via INTRAVENOUS

## 2016-08-28 MED ORDER — MENTHOL 3 MG MT LOZG: 1.0000 | LOZENGE | OROMUCOSAL | Status: DC | PRN

## 2016-08-28 MED ORDER — POLYETHYLENE GLYCOL 3350 17 G PO PACK: 17.0000 g | PACK | Freq: Every day | ORAL | Status: DC | PRN

## 2016-08-28 MED ORDER — PROMETHAZINE HCL 25 MG/ML IJ SOLN: 6.2500 mg | INTRAMUSCULAR | Status: DC | PRN

## 2016-08-28 MED ORDER — CHLORHEXIDINE GLUCONATE 4 % EX LIQD: 60.0000 mL | Freq: Once | CUTANEOUS | Status: DC

## 2016-08-28 MED ORDER — METOCLOPRAMIDE HCL 5 MG PO TABS: 5.0000 mg | ORAL_TABLET | Freq: Three times a day (TID) | ORAL | Status: DC | PRN

## 2016-08-28 MED ORDER — 0.9 % SODIUM CHLORIDE (POUR BTL) OPTIME
TOPICAL | Status: DC | PRN
  Administered 2016-08-28: 1000 mL

## 2016-08-28 MED ORDER — TRANEXAMIC ACID 1000 MG/10ML IV SOLN
2000.0000 mg | INTRAVENOUS | Status: AC
  Administered 2016-08-28: 2000 mg via TOPICAL
  Filled 2016-08-28: qty 20

## 2016-08-28 MED ORDER — TEMAZEPAM 15 MG PO CAPS
15.0000 mg | ORAL_CAPSULE | Freq: Every evening | ORAL | Status: DC | PRN
  Administered 2016-08-29: 15 mg via ORAL
  Filled 2016-08-28: qty 1

## 2016-08-28 MED ORDER — MIDAZOLAM HCL 2 MG/2ML IJ SOLN
INTRAMUSCULAR | Status: AC
  Filled 2016-08-28: qty 2

## 2016-08-28 MED ORDER — OXYCODONE-ACETAMINOPHEN 5-325 MG PO TABS: 1.0000 | ORAL_TABLET | ORAL | 0 refills | Status: DC | PRN

## 2016-08-28 MED ORDER — PHENOL 1.4 % MT LIQD: 1.0000 | OROMUCOSAL | Status: DC | PRN

## 2016-08-28 MED ORDER — GLYCOPYRROLATE 0.2 MG/ML IJ SOLN
INTRAMUSCULAR | Status: DC | PRN
  Administered 2016-08-28: 0.2 mg via INTRAVENOUS

## 2016-08-28 MED ORDER — MIDAZOLAM HCL 2 MG/2ML IJ SOLN
2.0000 mg | Freq: Once | INTRAMUSCULAR | Status: AC
  Administered 2016-08-28: 2 mg via INTRAVENOUS

## 2016-08-28 MED ORDER — WARFARIN - PHARMACIST DOSING INPATIENT: Freq: Every day | Status: DC

## 2016-08-28 MED ORDER — ACETAMINOPHEN 650 MG RE SUPP: 650.0000 mg | Freq: Four times a day (QID) | RECTAL | Status: DC | PRN

## 2016-08-28 MED ORDER — METHOCARBAMOL 500 MG PO TABS
500.0000 mg | ORAL_TABLET | Freq: Four times a day (QID) | ORAL | Status: DC | PRN
  Administered 2016-08-28 – 2016-08-29 (×3): 500 mg via ORAL
  Filled 2016-08-28 (×2): qty 1

## 2016-08-28 MED ORDER — FENTANYL CITRATE (PF) 100 MCG/2ML IJ SOLN
25.0000 ug | INTRAMUSCULAR | Status: DC | PRN
  Administered 2016-08-28 (×3): 50 ug via INTRAVENOUS

## 2016-08-28 MED ORDER — ONDANSETRON HCL 4 MG/2ML IJ SOLN
INTRAMUSCULAR | Status: AC
  Filled 2016-08-28: qty 2

## 2016-08-28 MED ORDER — CALCIUM CARBONATE 1250 (500 CA) MG PO TABS
1250.0000 mg | ORAL_TABLET | Freq: Every day | ORAL | Status: DC
  Administered 2016-08-29 – 2016-08-30 (×2): 1250 mg via ORAL
  Filled 2016-08-28 (×2): qty 1

## 2016-08-28 MED ORDER — BUPIVACAINE IN DEXTROSE 0.75-8.25 % IT SOLN
INTRATHECAL | Status: DC | PRN
  Administered 2016-08-28: 1.8 mL via INTRATHECAL

## 2016-08-28 MED ORDER — WARFARIN SODIUM 5 MG PO TABS: 5.0000 mg | ORAL_TABLET | Freq: Every day | ORAL | 0 refills | Status: AC

## 2016-08-28 MED ORDER — ONDANSETRON HCL 4 MG PO TABS
4.0000 mg | ORAL_TABLET | Freq: Four times a day (QID) | ORAL | Status: DC | PRN
  Administered 2016-08-29: 4 mg via ORAL
  Filled 2016-08-28: qty 1

## 2016-08-28 MED ORDER — PROPOFOL 1000 MG/100ML IV EMUL
INTRAVENOUS | Status: AC
  Filled 2016-08-28: qty 300

## 2016-08-28 MED ORDER — ONDANSETRON HCL 4 MG/2ML IJ SOLN
INTRAMUSCULAR | Status: DC | PRN
Start: 2016-08-28 — End: 2016-08-28
  Administered 2016-08-28: 4 mg via INTRAVENOUS

## 2016-08-28 MED ORDER — PHENYLEPHRINE 40 MCG/ML (10ML) SYRINGE FOR IV PUSH (FOR BLOOD PRESSURE SUPPORT)
PREFILLED_SYRINGE | INTRAVENOUS | Status: DC | PRN
  Administered 2016-08-28: 80 ug via INTRAVENOUS

## 2016-08-28 MED ORDER — PROPOFOL 10 MG/ML IV BOLUS
INTRAVENOUS | Status: DC | PRN
  Administered 2016-08-28: 20 mg via INTRAVENOUS
  Administered 2016-08-28: 30 mg via INTRAVENOUS
  Administered 2016-08-28: 20 mg via INTRAVENOUS

## 2016-08-28 MED ORDER — SODIUM CHLORIDE 0.9 % IR SOLN
Status: DC | PRN
  Administered 2016-08-28: 3000 mL

## 2016-08-28 MED ORDER — ADULT MULTIVITAMIN W/MINERALS CH
1.0000 | ORAL_TABLET | Freq: Every day | ORAL | Status: DC
  Administered 2016-08-29 – 2016-08-30 (×2): 1 via ORAL
  Filled 2016-08-28 (×2): qty 1

## 2016-08-28 MED ORDER — METHOCARBAMOL 500 MG PO TABS: 500.0000 mg | ORAL_TABLET | Freq: Three times a day (TID) | ORAL | 1 refills | Status: AC | PRN

## 2016-08-28 SURGICAL SUPPLY — 62 items
BANDAGE ESMARK 6X9 LF (GAUZE/BANDAGES/DRESSINGS) ×1 IMPLANT
BLADE SAG 18X100X1.27 (BLADE) ×3 IMPLANT
BLADE SAW SGTL 13X75X1.27 (BLADE) ×3 IMPLANT
BLADE SAW SGTL 18X1.27X75 (BLADE) ×2 IMPLANT
BLADE SAW SGTL 18X1.27X75MM (BLADE) ×1
BNDG CMPR 9X6 STRL LF SNTH (GAUZE/BANDAGES/DRESSINGS) ×1
BNDG CMPR MED 10X6 ELC LF (GAUZE/BANDAGES/DRESSINGS) ×1
BNDG ELASTIC 6X10 VLCR STRL LF (GAUZE/BANDAGES/DRESSINGS) ×3 IMPLANT
BNDG ESMARK 6X9 LF (GAUZE/BANDAGES/DRESSINGS) ×3
BNDG GAUZE ELAST 4 BULKY (GAUZE/BANDAGES/DRESSINGS) ×6 IMPLANT
BOWL SMART MIX CTS (DISPOSABLE) ×3 IMPLANT
CAP KNEE TOTAL 3 SIGMA ×2 IMPLANT
CEMENT HV SMART SET (Cement) ×6 IMPLANT
CLOSURE WOUND 1/2 X4 (GAUZE/BANDAGES/DRESSINGS) ×2
COVER SURGICAL LIGHT HANDLE (MISCELLANEOUS) ×3 IMPLANT
CUFF TOURNIQUET SINGLE 34IN LL (TOURNIQUET CUFF) ×2 IMPLANT
CUFF TOURNIQUET SINGLE 44IN (TOURNIQUET CUFF) IMPLANT
DRAPE EXTREMITY T 121X128X90 (DRAPE) ×3 IMPLANT
DRAPE IMP U-DRAPE 54X76 (DRAPES) ×3 IMPLANT
DRAPE PROXIMA HALF (DRAPES) ×3 IMPLANT
DRAPE U-SHAPE 47X51 STRL (DRAPES) ×3 IMPLANT
DRSG ADAPTIC 3X8 NADH LF (GAUZE/BANDAGES/DRESSINGS) ×3 IMPLANT
DRSG PAD ABDOMINAL 8X10 ST (GAUZE/BANDAGES/DRESSINGS) ×6 IMPLANT
DURAPREP 26ML APPLICATOR (WOUND CARE) ×3 IMPLANT
ELECT CAUTERY BLADE 6.4 (BLADE) ×3 IMPLANT
ELECT REM PT RETURN 9FT ADLT (ELECTROSURGICAL) ×3
ELECTRODE REM PT RTRN 9FT ADLT (ELECTROSURGICAL) ×1 IMPLANT
GAUZE SPONGE 4X4 12PLY STRL (GAUZE/BANDAGES/DRESSINGS) ×3 IMPLANT
GLOVE BIOGEL PI ORTHO PRO 7.5 (GLOVE) ×2
GLOVE BIOGEL PI ORTHO PRO SZ8 (GLOVE) ×2
GLOVE ORTHO TXT STRL SZ7.5 (GLOVE) ×3 IMPLANT
GLOVE PI ORTHO PRO STRL 7.5 (GLOVE) ×1 IMPLANT
GLOVE PI ORTHO PRO STRL SZ8 (GLOVE) ×1 IMPLANT
GLOVE SURG ORTHO 8.5 STRL (GLOVE) ×3 IMPLANT
GOWN STRL REUS W/ TWL XL LVL3 (GOWN DISPOSABLE) ×3 IMPLANT
GOWN STRL REUS W/TWL XL LVL3 (GOWN DISPOSABLE) ×9
HANDPIECE INTERPULSE COAX TIP (DISPOSABLE) ×3
IMMOBILIZER KNEE 22 UNIV (SOFTGOODS) ×2 IMPLANT
KIT BASIN OR (CUSTOM PROCEDURE TRAY) ×3 IMPLANT
KIT MANIFOLD (MISCELLANEOUS) ×1 IMPLANT
KIT ROOM TURNOVER OR (KITS) ×3 IMPLANT
MANIFOLD NEPTUNE II (INSTRUMENTS) ×3 IMPLANT
NS IRRIG 1000ML POUR BTL (IV SOLUTION) ×3 IMPLANT
PACK TOTAL JOINT (CUSTOM PROCEDURE TRAY) ×3 IMPLANT
PACK UNIVERSAL I (CUSTOM PROCEDURE TRAY) ×1 IMPLANT
PAD ARMBOARD 7.5X6 YLW CONV (MISCELLANEOUS) ×6 IMPLANT
SET HNDPC FAN SPRY TIP SCT (DISPOSABLE) ×1 IMPLANT
STRIP CLOSURE SKIN 1/2X4 (GAUZE/BANDAGES/DRESSINGS) ×4 IMPLANT
SUCTION FRAZIER HANDLE 10FR (MISCELLANEOUS) ×2
SUCTION TUBE FRAZIER 10FR DISP (MISCELLANEOUS) ×1 IMPLANT
SUT MNCRL AB 3-0 PS2 18 (SUTURE) ×3 IMPLANT
SUT VIC AB 0 CT1 27 (SUTURE) ×6
SUT VIC AB 0 CT1 27XBRD ANBCTR (SUTURE) ×2 IMPLANT
SUT VIC AB 1 CT1 27 (SUTURE) ×9
SUT VIC AB 1 CT1 27XBRD ANBCTR (SUTURE) ×3 IMPLANT
SUT VIC AB 2-0 CT1 27 (SUTURE) ×6
SUT VIC AB 2-0 CT1 TAPERPNT 27 (SUTURE) ×2 IMPLANT
TOWEL OR 17X24 6PK STRL BLUE (TOWEL DISPOSABLE) ×3 IMPLANT
TOWEL OR 17X26 10 PK STRL BLUE (TOWEL DISPOSABLE) ×3 IMPLANT
TRAY FOLEY CATH 16FRSI W/METER (SET/KITS/TRAYS/PACK) ×2 IMPLANT
UPCHARGE REV TRAY MBT KNEE ×2 IMPLANT
WATER STERILE IRR 1000ML POUR (IV SOLUTION) ×2 IMPLANT

## 2016-08-28 NOTE — Progress Notes (Signed)
ANTICOAGULATION CONSULT NOTE - Initial Consult  Pharmacy Consult for warfarin Indication: VTE prophylaxis  Allergies  Allergen Reactions  . No Known Allergies     Patient Measurements: Height: 5\' 8"  (172.7 cm) Weight: 226 lb 8 oz (102.7 kg) IBW/kg (Calculated) : 68.4  Vital Signs: Temp: 97.5 F (36.4 C) (09/08 1655) Temp Source: Oral (09/08 1655) BP: 145/67 (09/08 1655) Pulse Rate: 54 (09/08 1655)  Labs: No results for input(s): HGB, HCT, PLT, APTT, LABPROT, INR, HEPARINUNFRC, HEPRLOWMOCWT, CREATININE, CKTOTAL, CKMB, TROPONINI in the last 72 hours.  Estimated Creatinine Clearance: 106.9 mL/min (by C-G formula based on SCr of 0.8 mg/dL).   Medical History: Past Medical History:  Diagnosis Date  . Arthritis   . Edema extremities    B/LLE  . Hypothyroidism   . Morbid obesity ([place])   . OSA on CPAP     Assessment: 2 yom s/p L TKA on 9/8. Pharmacy consulted to dose warfarin for VTE prophylaxis post-op. Also on lovenox 30mg  [place] q12h. Not on anticoagulation pta. INR pending, CBC ok on 8/29, no bleed documented.   Goal of Therapy:  INR 2-3 Monitor platelets by anticoagulation protocol: Yes   Plan:  Warfarin 7.5mg  x 1 dose tonight Lovenox 30mg  [place] q12h per MD - d/c when INR therapeutic Daily INR Monitor for signs and symptoms of bleeding   Elicia Lamp, PharmD, BCPS Clinical Pharmacist Pager [phone] 08/28/2016 5:03 PM

## 2016-08-28 NOTE — Transfer of Care (Signed)
Immediate Anesthesia Transfer of Care Note  Patient: Raquan Carrol  Procedure(s) Performed: Procedure(s): TOTAL KNEE ARTHROPLASTY (Left)  Patient Location: PACU  Anesthesia Type:Spinal  Level of Consciousness: awake, alert  and oriented  Airway & Oxygen Therapy: Patient Spontanous Breathing  Post-op Assessment: Report given to RN and Post -op Vital signs reviewed and stable  Post vital signs: Reviewed and stable  Last Vitals:  Vitals:   08/28/16 1045 08/28/16 1459  BP: (!) 145/60   Pulse: (!) 57   Resp: 18   Temp: 36.9 C (P) 36.1 C    Last Pain:  Vitals:   08/28/16 1459  TempSrc:   PainSc: (P) 0-No pain         Complications: No apparent anesthesia complications

## 2016-08-28 NOTE — Interval H&P Note (Signed)
History and Physical Interval Note:  08/28/2016 12:06 PM  Tony Ho  has presented today for surgery, with the diagnosis of LEFT KNEE INSTAGE OA  The various methods of treatment have been discussed with the patient and family. After consideration of risks, benefits and other options for treatment, the patient has consented to  Procedure(s): TOTAL KNEE ARTHROPLASTY (Left) as a surgical intervention .  The patient's history has been reviewed, patient examined, no change in status, stable for surgery.  I have reviewed the patient's chart and labs.  Questions were answered to the patient's satisfaction.     Kynslie Ringle,STEVEN R

## 2016-08-28 NOTE — Discharge Instructions (Signed)
Ice to the knee as much as you can.  Keep the incision clean and dry and covered for one week, then ok to shower and get it wet.  Do exercises every hour while awake - work on both bending and straightening.  Do not prop anything under the knee, prop under the ankle to help with knee extension  Wear the knee immobilizer at night to maintain knee extension.  CPM 6 hours per day in two hour increments 0-90 degrees   Weight bearing as tolerated on left knee  Follow up in two weeks  [phone]

## 2016-08-28 NOTE — Brief Op Note (Signed)
08/28/2016  2:57 PM  PATIENT:  Tony Ho  65 y.o. male  PRE-OPERATIVE DIAGNOSIS:  LEFT KNEE ENDSTAGE OA  POST-OPERATIVE DIAGNOSIS:  LEFT KNEE ENDSTAGE OA  PROCEDURE:  Procedure(s): TOTAL KNEE ARTHROPLASTY (Left) DePuy Sigma RP  SURGEON:  Surgeon(s) and Role:    * Netta Cedars, MD - Primary  PHYSICIAN ASSISTANT:   ASSISTANTS: Ventura Bruns, PA-C   ANESTHESIA:   regional and spinal  EBL:  Total I/O In: 1400 [I.V.:1400] Out: 150 [Blood:150]  BLOOD ADMINISTERED:none  DRAINS: none   LOCAL MEDICATIONS USED:  NONE  SPECIMEN:  No Specimen  DISPOSITION OF SPECIMEN:  N/A  COUNTS:  YES  TOURNIQUET:   Total Tourniquet Time Documented: Thigh (Left) - 99 minutes Total: Thigh (Left) - 99 minutes   DICTATION: .Other Dictation: Dictation Number [id number]  PLAN OF CARE: Admit to inpatient   PATIENT DISPOSITION:  PACU - hemodynamically stable.   Delay start of Pharmacological VTE agent (>24hrs) due to surgical blood loss or risk of bleeding: no

## 2016-08-28 NOTE — Anesthesia Procedure Notes (Signed)
Anesthesia Regional Block:  Adductor canal block  Pre-Anesthetic Checklist: ,, timeout performed, Correct Patient, Correct Site, Correct Laterality, Correct Procedure, Correct Position, site marked, Risks and benefits discussed,  Surgical consent,  Pre-op evaluation,  At surgeon's request and post-op pain management  Laterality: Left  Prep: chloraprep       Needles:   Needle Type: Echogenic Needle     Needle Length: 5cm 5 cm Needle Gauge: 22 and 22 G    Additional Needles:  Procedures: ultrasound guided (picture in chart) Adductor canal block Narrative:  Start time: 08/28/2016 11:47 AM End time: 08/28/2016 11:51 AM Injection made incrementally with aspirations every 25 mL.  Performed by: Personally  Anesthesiologist: Reginal Lutes  Additional Notes: Patient tolerated procedure well.

## 2016-08-28 NOTE — Progress Notes (Signed)
Pt arrived to floor A&O x4 with no complains, dangled on bed and walked about 10 without pain

## 2016-08-28 NOTE — Anesthesia Postprocedure Evaluation (Signed)
Anesthesia Post Note  Patient: Tony Ho  Procedure(s) Performed: Procedure(s) (LRB): TOTAL KNEE ARTHROPLASTY (Left)  Patient location during evaluation: PACU Anesthesia Type: Spinal Level of consciousness: oriented and awake and alert Pain management: pain level controlled Vital Signs Assessment: post-procedure vital signs reviewed and stable Respiratory status: spontaneous breathing, respiratory function stable and patient connected to nasal cannula oxygen Cardiovascular status: blood pressure returned to baseline and stable Postop Assessment: no headache and no backache Anesthetic complications: no    Last Vitals:  Vitals:   08/28/16 1530 08/28/16 1545  BP: 140/69 139/70  Pulse: 65 60  Resp: 16 13  Temp:      Last Pain:  Vitals:   08/28/16 1545  TempSrc:   PainSc: [place]

## 2016-08-28 NOTE — Progress Notes (Signed)
Orthopedic Tech Progress Note Patient Details:  Tony Ho [birth year] [id number]  CPM Left Knee CPM Left Knee: On Left Knee Flexion (Degrees): 90 Left Knee Extension (Degrees): 0 Additional Comments: trapeze bar patient helper   Hildred Priest 08/28/2016, 4:05 PM Viewed order from doctor's order list

## 2016-08-28 NOTE — Evaluation (Signed)
Physical Therapy Evaluation Patient Details Name: Tony Ho MRN: [id number] DOB: 17-Dec-1951 Today's Date: 08/28/2016   History of Present Illness  Pt is a pleasant 65 y/o male s/p L TKA. PMH including but not limited to bilateral LE edema.  Clinical Impression  Pt presented supine in bed with HOB elevated, L LE in CPM, awake and eager to participate in therapy session. Prior to admission, pt stated that he was independent with all functional mobility. Pt's wife was present throughout session. Pt moving well during evaluation and ambulated approximately 100 ft with min guard and RW. Pt would continue to benefit from skilled physical therapy services at this time while admitted and after d/c to address his below listed limitations in order to improve his overall safety and independence with functional mobility.     Follow Up Recommendations Home health PT;Supervision for mobility/OOB    Equipment Recommendations  3in1 (PT);Other (comment) (pt's wife stated that pt will have a RW delivered to home)    Recommendations for Other Services       Precautions / Restrictions Precautions Precautions: Knee Precaution Comments: PT reviewed positioning of LE following TKA with pt and pt's wife. Required Braces or Orthoses: Knee Immobilizer - Left Knee Immobilizer - Left: On except when in CPM;Other (comment) (MD order states on in bed unless in CPM) Restrictions Weight Bearing Restrictions: Yes LLE Weight Bearing: Weight bearing as tolerated      Mobility  Bed Mobility Overal bed mobility: Needs Assistance Bed Mobility: Supine to Sit     Supine to sit: Min guard;HOB elevated     General bed mobility comments: pt required increased time and use of bed rails  Transfers Overall transfer level: Needs assistance Equipment used: Rolling walker (2 wheeled) Transfers: Sit to/from Stand Sit to Stand: Min guard         General transfer comment: pt required increased time and VC'ing  for bilateral hand positioning  Ambulation/Gait Ambulation/Gait assistance: Min guard Ambulation Distance (Feet): 100 Feet Assistive device: Rolling walker (2 wheeled) Gait Pattern/deviations: Step-through pattern;Decreased step length - right;Decreased stance time - left;Decreased weight shift to left;Trunk flexed Gait velocity: decreased Gait velocity interpretation: Below normal speed for age/gender General Gait Details: pt required VC'ing for sequencing with RW  Stairs            Wheelchair Mobility    Modified Rankin (Stroke Patients Only)       Balance Overall balance assessment: Needs assistance Sitting-balance support: Feet supported;No upper extremity supported Sitting balance-Leahy Scale: Fair     Standing balance support: During functional activity;Bilateral upper extremity supported Standing balance-Leahy Scale: Poor                               Pertinent Vitals/Pain Pain Assessment: 0-10 Pain Score: 5  Pain Location: L knee and thigh Pain Descriptors / Indicators: Burning;Guarding Pain Intervention(s): Monitored during session;Repositioned;Ice applied    Home Living Family/patient expects to be discharged to:: Private residence Living Arrangements: Spouse/significant other Available Help at Discharge: Family;Available 24 hours/day Type of Home: House Home Access: Stairs to enter Entrance Stairs-Rails: None Entrance Stairs-Number of Steps: 2 Home Layout: One level Home Equipment: Walker - 2 wheels;Cane - single point      Prior Function Level of Independence: Independent               Hand Dominance        Extremity/Trunk Assessment   Upper Extremity Assessment: Overall  WFL for tasks assessed           Lower Extremity Assessment: LLE deficits/detail   LLE Deficits / Details: Pt with decreased strength and ROM limitations secondary to post-op. Sensation is diminished.     Communication   Communication: No  difficulties  Cognition Arousal/Alertness: Awake/alert Behavior During Therapy: WFL for tasks assessed/performed Overall Cognitive Status: Within Functional Limits for tasks assessed                      General Comments      Exercises        Assessment/Plan    PT Assessment Patient needs continued PT services  PT Diagnosis Difficulty walking   PT Problem List Decreased strength;Decreased range of motion;Decreased balance;Decreased activity tolerance;Decreased mobility;Decreased coordination;Decreased knowledge of use of DME;Pain  PT Treatment Interventions DME instruction;Gait training;Stair training;Functional mobility training;Therapeutic activities;Therapeutic exercise;Balance training;Neuromuscular re-education;Patient/family education   PT Goals (Current goals can be found in the Care Plan section) Acute Rehab PT Goals Patient Stated Goal: return home in the morning PT Goal Formulation: With patient/family Time For Goal Achievement: 09/04/16 Potential to Achieve Goals: Good    Frequency 7X/week   Barriers to discharge        Co-evaluation               End of Session Equipment Utilized During Treatment: Gait belt Activity Tolerance: Patient tolerated treatment well Patient left: in chair;with call bell/phone within reach;with family/visitor present Nurse Communication: Mobility status         Time: 1720-1749 PT Time Calculation (min) (ACUTE ONLY): 29 min   Charges:   PT Evaluation $PT Eval Moderate Complexity: 1 Procedure PT Treatments $Gait Training: 8-22 mins   PT G CodesClearnce Sorrel Wylan Gentzler 08/28/2016, 6:16 PM Sherie Don, [place], DPT [phone]

## 2016-08-28 NOTE — Anesthesia Preprocedure Evaluation (Addendum)
Anesthesia Evaluation  Patient identified by MRN, date of birth, ID band Patient awake    Reviewed: Allergy & Precautions, H&P , NPO status , Patient's Chart, lab work & pertinent test results  Airway Mallampati: II  TM Distance: >3 FB Neck ROM: Full    Dental no notable dental hx.    Pulmonary sleep apnea , former smoker,    Pulmonary exam normal        Cardiovascular Normal cardiovascular exam     Neuro/Psych    GI/Hepatic   Endo/Other  Hypothyroidism   Renal/GU      Musculoskeletal  (+) Arthritis ,   Abdominal   Peds  Hematology   Anesthesia Other Findings   Reproductive/Obstetrics                            Anesthesia Physical  Anesthesia Plan  ASA: II  Anesthesia Plan: Regional and Spinal   Post-op Pain Management:  Regional for Post-op pain   Induction: Intravenous  Airway Management Planned: LMA  Additional Equipment:   Intra-op Plan:   Post-operative Plan: Extubation in OR  Informed Consent: I have reviewed the patients History and Physical, chart, labs and discussed the procedure including the risks, benefits and alternatives for the proposed anesthesia with the patient or authorized representative who has indicated his/her understanding and acceptance.     Plan Discussed with: CRNA and Surgeon  Anesthesia Plan Comments:        Anesthesia Quick Evaluation

## 2016-08-28 NOTE — Anesthesia Procedure Notes (Signed)
Spinal  Patient location during procedure: OR Start time: 08/28/2016 12:47 PM End time: 08/28/2016 12:52 PM Staffing Anesthesiologist: Reginal Lutes Performed: anesthesiologist  Preanesthetic Checklist Completed: patient identified, site marked, surgical consent, pre-op evaluation, timeout performed, IV checked, risks and benefits discussed and monitors and equipment checked Spinal Block Patient position: sitting Prep: ChloraPrep Patient monitoring: heart rate, cardiac monitor, continuous pulse ox and blood pressure Approach: midline Location: L3-4 Injection technique: single-shot Needle Needle type: Pencil-Tip  Needle gauge: 25 G

## 2016-08-29 LAB — PROTIME-INR
INR: 1.07
INR: 1.03
PROTHROMBIN TIME: 13.5 s (ref 11.4–15.2)
PROTHROMBIN TIME: 14 s (ref 11.4–15.2)

## 2016-08-29 LAB — BASIC METABOLIC PANEL
ANION GAP: 9 (ref 5–15)
BUN: 7 mg/dL (ref 6–20)
CHLORIDE: 101 mmol/L (ref 101–111)
CO2: 24 mmol/L (ref 22–32)
Calcium: 8.3 mg/dL — ABNORMAL LOW (ref 8.9–10.3)
Creatinine, Ser: 0.7 mg/dL (ref 0.61–1.24)
GFR calc Af Amer: >60 mL/min (ref >60)
Glucose, Bld: 133 mg/dL — ABNORMAL HIGH (ref 65–99)
POTASSIUM: 3.8 mmol/L (ref 3.5–5.1)
SODIUM: 134 mmol/L — AB (ref 135–145)

## 2016-08-29 LAB — CBC
HCT: 35.6 % — ABNORMAL LOW (ref 39.0–52.0)
HCT: 36.8 % — ABNORMAL LOW (ref 39.0–52.0)
HEMOGLOBIN: 12 g/dL — AB (ref 13.0–17.0)
Hemoglobin: 12.7 g/dL — ABNORMAL LOW (ref 13.0–17.0)
MCH: 32.3 pg (ref 26.0–34.0)
MCH: 33.2 pg (ref 26.0–34.0)
MCHC: 33.7 g/dL (ref 30.0–36.0)
MCHC: 34.5 g/dL (ref 30.0–36.0)
MCV: 96 fL (ref 78.0–100.0)
MCV: 96.1 fL (ref 78.0–100.0)
PLATELETS: 185 10*3/uL (ref 150–400)
PLATELETS: 205 10*3/uL (ref 150–400)
RBC: 3.71 MIL/uL — AB (ref 4.22–5.81)
RBC: 3.83 MIL/uL — AB (ref 4.22–5.81)
RDW: 12.4 % (ref 11.5–15.5)
RDW: 12.4 % (ref 11.5–15.5)
WBC: 9.2 10*3/uL (ref 4.0–10.5)
WBC: 9.4 10*3/uL (ref 4.0–10.5)

## 2016-08-29 LAB — CREATININE, SERUM
Creatinine, Ser: 0.72 mg/dL (ref 0.61–1.24)
GFR calc non Af Amer: >60 mL/min (ref >60)

## 2016-08-29 MED ORDER — OXYCODONE HCL 5 MG PO TABS
5.0000 mg | ORAL_TABLET | ORAL | Status: DC | PRN
  Administered 2016-08-29 – 2016-08-30 (×5): 15 mg via ORAL
  Filled 2016-08-29 (×6): qty 3

## 2016-08-29 MED ORDER — WARFARIN SODIUM 7.5 MG PO TABS
7.5000 mg | ORAL_TABLET | Freq: Once | ORAL | Status: AC
  Administered 2016-08-29: 7.5 mg via ORAL
  Filled 2016-08-29: qty 1

## 2016-08-29 NOTE — Op Note (Signed)
NAMEJAWAN, KLINKER NO.:  [id number]  MEDICAL RECORD NO.:  [id number]  LOCATION:  5N21C                        FACILITY:  [facility]  PHYSICIAN:  Doran Heater. Veverly Fells, M.D. DATE OF BIRTH:  1951-01-20  DATE OF PROCEDURE:  08/28/2016 DATE OF DISCHARGE:                              OPERATIVE REPORT   PREOPERATIVE DIAGNOSIS:  Left knee end-stage arthritis.  POSTOPERATIVE DIAGNOSIS:  Left knee end-stage arthritis.  PROCEDURE PERFORMED:  Left total knee replacement using DePuy Sigma Rotating Platform prosthesis with MBT revision tray.  ATTENDING SURGEON:  Doran Heater. Veverly Fells, M.D.  ASSISTANT:  Abbott Pao. Dixon, PA-C, who scrubbed for the entire procedure and necessary for satisfactory completion of surgery.  ANESTHESIA:  Spinal anesthesia plus adductor canal block was used.  ESTIMATED BLOOD LOSS:  Less than 100 mL.  FLUID REPLACEMENT:  1500 mL of crystalloid.  INSTRUMENT COUNTS:  Correct.  COMPLICATIONS:  There were no complications.  ANTIBIOTICS:  Perioperative antibiotics were given.  INDICATIONS:  The patient is a 65 year old male, who presents with end- stage arthritis of left knee.  The patient has had progressive osteoarthritis that is despite conservative management and now has activity limiting pain.  He is only able to walk about a block secondary to pain, and his knee has failed conservative management including injections, modification, activity, anti-inflammatories, therapy; presents for operative treatment to restore function and eliminate pain to his knee.  Informed consent obtained.  DESCRIPTION OF PROCEDURE:  After an adequate level of anesthesia was achieved, the patient was positioned supine on the operating room table. Left leg was correctly identified, and a nonsterile tourniquet placed on the proximal thigh.  The patient had a spinal, so we checked the spinal level at a time.  Once that tourniquet was placed, the patient was padded and  positioned appropriately, we sterilely prepped and draped the leg in usual manner.  Time-out called.  We then elevated and exsanguinated the leg with an Esmarch bandage.  Elevated the tourniquet to 300 mmHg.  Placed the knee in flexion.  A longitudinal midline incision was created with a #10 blade scalpel, dissection down through the subcutaneous tissues using the scalpel.  I identified the parapatellar tissues and performed a medial parapatellar arthrotomy with a fresh #10 blade scalpel.  We everted the patella, divided the lateral patellofemoral ligaments.  At this stage, we entered the distal femur using a step-cut drill.  We then placed intramedullary resection guide and resected 10 mm of bone off the affected left distal femur with a 5- degree valgus setting.  Once that was done, we measured and sized the femur to a size 5 anterior down and then placed our 4-in-1 jig and did our anterior, posterior, and chamfer cuts with the oscillating saw. Next, we removed the remaining ACL, PCL, and meniscal tissue; subluxed the tibia anteriorly and placed our external alignment jig for the tibial cut.  We cut the 2 degrees of posterior slope with the external jig perpendicular to the long axis of the tibia.  We then went ahead and removed excess posterior bone off the posterior femoral condyles and any contracted capsule that was released.  We checked our gaps, which were symmetric at 10  mm.  We then went on and completed our tibial preparation with a modular drill and keel punch for the MBT revision tray, which was sized as a size #4.  We then did our box cut for the posterior cruciate substituting femoral prosthesis.  We went ahead and placed for box cut guide, then used the oscillating saw for that.  We impacted the size #5 left femur in place and then reduced the knee with a size 5+, 10 poly and reduced the knee.  We were able to get full extension, it was stable in flexion.  We then  resurfaced the patella going from a 25 mm thickness down to 16 mm thickness and sized it to a #38 patella after we cut the patella with the patellar jig.  We drilled our lug holes and placed our #38 patellar in place and then ranged the knee.  We had a normal tracking with no-touch technique.  We removed all trial components.  I went ahead and drilled out some of the hard bone on the medial tibia.  We removed some osteophytes around the medial tibial side, and at this point, went ahead and irrigated it thoroughly and then dried for the cementing.  We cemented all the components into place; the size 4 tibia, MBT rough tray, and a size 5 left femur and we used a 10 trial spacer in place.  Once we cemented using a vacuum mixing technique with a high-viscosity cement and once we had cemented all surfaces and placed the knee in extension with a poly spacer, we cemented the patella into place and used a clamp and we allowed the cement to harden before ranging the knee and assessing stability.  We felt like the 10 was stable in flexion and extension in order to get to reduce.  There was a nice little pop medially.  So, I definitely felt like we could not get a 12 in and without overstuffing.  We removed the trial component, looked for any excess cement, which were removed using a quarter-inch curved osteotome, final inspection, lavaged the joint, and we placed the real MBT revision tray, poly spacer in place, and it was a 10 mm, and then, we reduced the knee with a nice snap.  We were able to get the patella back into the proper position and checked patellar stability, which was again normal with no-touch technique without clamping at all the medial parapatellar retinaculum.  We then went ahead and began our suturing of the medial parapatellar arthrotomy with #1 Vicryl suture and then placed the topical TXA.  We had given IV TXA (tranexamic acid) as well, and then, we began repairing with the TXA  in the joint, and then we sucked out the TXA as we were completing our closure of arthrotomy and the medial parapatellar arthrotomy.  So, #1 Vicryl for the deep layer of the arthrotomy and the parapatellar arthrotomy, and then the 0 Vicryl, 2-0 Vicryl layered subcutaneous closure, and 4-0 running Monocryl for skin. Steri-Strips were applied followed by a sterile dressing.  The patient tolerated the surgery well.     Doran Heater. Veverly Fells, M.D.     SRN/MEDQ  D:  08/28/2016  T:  08/29/2016  Job:  [id number]

## 2016-08-29 NOTE — Progress Notes (Signed)
ANTICOAGULATION CONSULT NOTE - Follow up [facility] for warfarin Indication: VTE prophylaxis  Allergies  Allergen Reactions  . No Known Allergies     Patient Measurements: Height: 5\' 8"  (172.7 cm) Weight: 226 lb 8 oz (102.7 kg) IBW/kg (Calculated) : 68.4  Vital Signs: Temp: 97.7 F (36.5 C) (09/09 0732) Temp Source: Oral (09/09 0732) BP: 141/54 (09/09 0732) Pulse Rate: 63 (09/09 0732)  Labs:  Recent Labs  08/29/16 0011 08/29/16 0355  HGB 12.7* 12.0*  HCT 36.8* 35.6*  PLT 185 205  LABPROT 13.5 14.0  INR 1.03 1.07  CREATININE 0.72 0.70    Estimated Creatinine Clearance: 106.9 mL/min (by C-G formula based on SCr of 0.8 mg/dL).   Medical History: Past Medical History:  Diagnosis Date  . Arthritis   . Edema extremities    B/LLE  . Hypothyroidism   . Morbid obesity ([place])   . OSA on CPAP     Assessment: 65yo M s/p L TKA on 9/8. Pharmacy consulted to dose warfarin for VTE prophylaxis post-op. On Lovenox bridge. No anticoagulation PTA. INR 1.07, Hgb 12, Plt 205, no bleed documented.  Goal of Therapy:  INR 2-3 Monitor platelets by anticoagulation protocol: Yes   Plan:  Warfarin 7.5mg  x 1 dose tonight Lovenox 30mg  [place] q12h per MD - d/c when INR therapeutic Daily INR Monitor CBC, s/sx of bleeding   Gwenlyn Perking, PharmD PGY1 Pharmacy Resident Pager: [phone] 08/29/2016 11:31 AM

## 2016-08-29 NOTE — Progress Notes (Signed)
Received referral to arrange home RN for PT/INR monitoring and a CPM for home. Contacted Butch Penny at [facility] for home RN and Reggie at [facility] for [place].

## 2016-08-29 NOTE — Evaluation (Signed)
Occupational Therapy Evaluation Patient Details Name: Tony Ho MRN: [id number] DOB: November 15, 1951 Today's Date: 08/29/2016    History of Present Illness Pt is a pleasant 65 y/o male s/p L TKA. PMH including but not limited to bilateral LE edema.   Clinical Impression   Pt admitted with the above diagnoses and presents with below problem list. Pt will benefit from continued acute OT to address the below listed deficits and maximize independence with basic ADLs prior to d/c home. PTA pt independent with ADLs. Pt currently min guard with LB ADLs and functional mobility, min A for bed mobility.       Follow Up Recommendations  No OT follow up;Supervision/Assistance - 24 hour    Equipment Recommendations  3 in 1 bedside comode    Recommendations for Other Services       Precautions / Restrictions Precautions Precautions: Knee Precaution Comments: reviewed Required Braces or Orthoses: Knee Immobilizer - Left Knee Immobilizer - Left: On except when in CPM;Other (comment) Restrictions Weight Bearing Restrictions: Yes LLE Weight Bearing: Weight bearing as tolerated      Mobility Bed Mobility Overal bed mobility: Needs Assistance Bed Mobility: Sit to Supine       Sit to supine: Min assist   General bed mobility comments: assist to advance LLE onto bed  Transfers Overall transfer level: Needs assistance Equipment used: Rolling walker (2 wheeled) Transfers: Sit to/from Stand Sit to Stand: Min guard         General transfer comment: from EOB, increased time    Balance Overall balance assessment: Needs assistance Sitting-balance support: Feet supported;No upper extremity supported Sitting balance-Leahy Scale: Fair     Standing balance support: Bilateral upper extremity supported Standing balance-Leahy Scale: Poor                              ADL Overall ADL's : Needs assistance/impaired Eating/Feeding: Set up;Sitting   Grooming: Set up;Min  guard;Sitting;Standing   Upper Body Bathing: Set up;Sitting   Lower Body Bathing: Min guard;With adaptive equipment;Sit to/from stand   Upper Body Dressing : Set up;Sitting   Lower Body Dressing: Min guard;With adaptive equipment;Sit to/from stand   Toilet Transfer: Min guard;Ambulation;RW   Toileting- Water quality scientist and Hygiene: Min guard;Sit to/from stand   Tub/ Shower Transfer: Min guard;Ambulation;3 in 1   Functional mobility during ADLs: Min guard;Rolling walker General ADL Comments: Pt standing at bedside returning to bed from chair upon therapist arrival. Side steps to right and bed mobility. ADL education given.      Vision     Perception     Praxis      Pertinent Vitals/Pain Pain Assessment: 0-10 Pain Score: 5  Faces Pain Scale: Hurts a little bit Pain Location: L knee Pain Descriptors / Indicators: Guarding Pain Intervention(s): Limited activity within patient's tolerance;Monitored during session;Repositioned;Other (comment) (nurse recently gave pain med)     Hand Dominance     Extremity/Trunk Assessment Upper Extremity Assessment Upper Extremity Assessment: Overall WFL for tasks assessed   Lower Extremity Assessment Lower Extremity Assessment: Defer to PT evaluation       Communication Communication Communication: No difficulties   Cognition Arousal/Alertness: Awake/alert Behavior During Therapy: WFL for tasks assessed/performed Overall Cognitive Status: Within Functional Limits for tasks assessed                     General Comments       Exercises  Shoulder Instructions      Home Living Family/patient expects to be discharged to:: Private residence Living Arrangements: Spouse/significant other Available Help at Discharge: Family;Available 24 hours/day Type of Home: House Home Access: Stairs to enter [employer] of Steps: 2 Entrance Stairs-Rails: None Home Layout: One level     Bathroom Shower/Tub:  Tub/shower unit Shower/tub characteristics: Curtain Biochemist, clinical: Standard     Home Equipment: Environmental consultant - 2 wheels;Cane - single point          Prior Functioning/Environment Level of Independence: Independent             OT Diagnosis: Acute pain   OT Problem List: Impaired balance (sitting and/or standing);Decreased knowledge of use of DME or AE;Decreased knowledge of precautions;Pain   OT Treatment/Interventions: Self-care/ADL training;DME and/or AE instruction;Therapeutic activities;Patient/family education;Balance training    OT Goals(Current goals can be found in the care plan section) Acute Rehab OT Goals Patient Stated Goal: sleep better tonight, less pain OT Goal Formulation: With patient Time For Goal Achievement: 09/05/16 Potential to Achieve Goals: Good ADL Goals Pt Will Perform Lower Body Bathing: with modified independence;sit to/from stand Pt Will Perform Lower Body Dressing: with modified independence;sit to/from stand;with adaptive equipment Pt Will Transfer to Toilet: with modified independence;ambulating Pt Will Perform Toileting - Clothing Manipulation and hygiene: with modified independence;sit to/from stand Pt Will Perform Tub/Shower Transfer: with supervision;ambulating;rolling walker  OT Frequency: Min 2X/week   Barriers to D/C:            Co-evaluation              End of Session Equipment Utilized During Treatment: Rolling walker CPM Left Knee CPM Left Knee: On Left Knee Flexion (Degrees): 60 Left Knee Extension (Degrees): 0 Additional Comments: pain reported at 64* knee flexion in CPM, decreased to 60*  Activity Tolerance: Patient tolerated treatment well;Patient limited by pain Patient left: in bed;with call bell/phone within reach;in CPM;with family/visitor present   Time: 1540-1602 OT Time Calculation (min): 22 min Charges:  OT General Charges $OT Visit: 1 Procedure OT Evaluation $OT Eval Low Complexity: 1  Procedure G-Codes:    Hortencia Pilar 2016-09-26, 4:17 PM

## 2016-08-29 NOTE — Progress Notes (Signed)
Physical Therapy Treatment Patient Details Name: Tony Ho MRN: [id number] DOB: 1951/05/01 Today's Date: 08/29/2016    History of Present Illness Pt is a pleasant 65 y/o male s/p L TKA. PMH including but not limited to bilateral LE edema.    PT Comments    Pt presented sitting OOB in recliner when PT entered room. Pt making excellent progress towards his functional goals and successfully completed stair training this session. Pt would continue to benefit from skilled physical therapy services at this time while admitted and after d/c to address his limitations in order to improve his overall safety and independence with functional mobility.   Follow Up Recommendations  Home health PT;Supervision for mobility/OOB     Equipment Recommendations  Rolling walker with 5" wheels;3in1 (PT)    Recommendations for Other Services       Precautions / Restrictions Precautions Precautions: Knee Precaution Comments: PT reviewed positioning of LE following TKA with pt and pt's wife. Restrictions Weight Bearing Restrictions: Yes LLE Weight Bearing: Weight bearing as tolerated    Mobility  Bed Mobility               General bed mobility comments: Pt sitting OOB in recliner when PT entered room  Transfers Overall transfer level: Needs assistance Equipment used: Rolling walker (2 wheeled) Transfers: Sit to/from Stand Sit to Stand: Min guard         General transfer comment: pt required increased time  Ambulation/Gait Ambulation/Gait assistance: Min guard Ambulation Distance (Feet): 200 Feet Assistive device: Rolling walker (2 wheeled) Gait Pattern/deviations: Step-through pattern;Decreased step length - right;Decreased stance time - left;Decreased weight shift to left;Trunk flexed Gait velocity: decreased Gait velocity interpretation: Below normal speed for age/gender     Stairs Stairs: Yes Stairs assistance: Min guard Stair Management: No rails;Backwards;With  walker Number of Stairs: 1 General stair comments: pt ascended with R LE leading and descended with L LE leading  Wheelchair Mobility    Modified Rankin (Stroke Patients Only)       Balance Overall balance assessment: Needs assistance Sitting-balance support: Feet supported;No upper extremity supported Sitting balance-Leahy Scale: Fair     Standing balance support: During functional activity;Bilateral upper extremity supported Standing balance-Leahy Scale: Poor                      Cognition Arousal/Alertness: Awake/alert Behavior During Therapy: WFL for tasks assessed/performed Overall Cognitive Status: Within Functional Limits for tasks assessed                      Exercises      General Comments        Pertinent Vitals/Pain Pain Assessment: Faces Faces Pain Scale: Hurts a little bit Pain Location: L knee Pain Descriptors / Indicators: Guarding Pain Intervention(s): Monitored during session;Repositioned;Ice applied    Home Living                      Prior Function            PT Goals (current goals can now be found in the care plan section) Acute Rehab PT Goals Patient Stated Goal: return home today PT Goal Formulation: With patient/family Time For Goal Achievement: 09/04/16 Potential to Achieve Goals: Good Progress towards PT goals: Progressing toward goals    Frequency  7X/week    PT Plan Current plan remains appropriate    Co-evaluation             End of  Session Equipment Utilized During Treatment: Gait belt Activity Tolerance: Patient limited by fatigue;Patient limited by pain Patient left: in chair;with call bell/phone within reach;with family/visitor present     Time: 1415-1441 PT Time Calculation (min) (ACUTE ONLY): 26 min  Charges:  $Gait Training: 8-22 mins $Therapeutic Activity: 8-22 mins                    G CodesClearnce Sorrel Samy Ryner Aug 31, 2016, 2:58 PM Sherie Don, [place], DPT [phone]

## 2016-08-29 NOTE — Evaluation (Signed)
Physical Therapy Evaluation Patient Details Name: Tony Ho MRN: [id number] DOB: 11/06/1951 Today's Date: 08/29/2016   History of Present Illness  Pt is a pleasant 65 y/o male s/p L TKA. PMH including but not limited to bilateral LE edema.  Clinical Impression  Pt presented supine in bed with HOB elevated, L LE in CPM, awake and willing to participate in therapy session. Pt reported increased pain since previous session and that he was unable to get much sleep last night. However, pt still very motivated and continues to make progress towards achieving his functional goals. Pt would continue to benefit from skilled physical therapy services at this time while admitted and after d/c to address his limitations in order to improve his overall safety and independence with functional mobility. PT plans to stair train at next session.     Follow Up Recommendations Home health PT;Supervision for mobility/OOB    Equipment Recommendations  3in1 (PT);Other (comment) (pt's wife stated that a RW would be delivered to home)    Recommendations for Other Services       Precautions / Restrictions Precautions Precautions: Knee Precaution Comments: PT reviewed positioning of LE following TKA with pt and pt's wife. Required Braces or Orthoses: Knee Immobilizer - Left Knee Immobilizer - Left: On except when in CPM;Other (comment) (per MD's order, on in bed when not in CPM) Restrictions Weight Bearing Restrictions: Yes LLE Weight Bearing: Weight bearing as tolerated      Mobility  Bed Mobility Overal bed mobility: Needs Assistance Bed Mobility: Supine to Sit     Supine to sit: Min guard;HOB elevated     General bed mobility comments: pt required increased time and use of bed rails  Transfers Overall transfer level: Needs assistance Equipment used: Rolling walker (2 wheeled) Transfers: Sit to/from Stand Sit to Stand: Min guard         General transfer comment: pt required increased  time and VC'ing for bilateral hand positioning  Ambulation/Gait Ambulation/Gait assistance: Min guard Ambulation Distance (Feet): 100 Feet Assistive device: Rolling walker (2 wheeled) Gait Pattern/deviations: Step-to pattern;Decreased step length - right;Decreased stance time - left;Decreased weight shift to left;Trunk flexed Gait velocity: decreased Gait velocity interpretation: Below normal speed for age/gender    Stairs            Wheelchair Mobility    Modified Rankin (Stroke Patients Only)       Balance Overall balance assessment: Needs assistance Sitting-balance support: Feet supported;No upper extremity supported Sitting balance-Leahy Scale: Fair     Standing balance support: During functional activity;No upper extremity supported Standing balance-Leahy Scale: Fair                               Pertinent Vitals/Pain Pain Assessment: 0-10 Pain Score: 8  Pain Location: L knee and thigh Pain Descriptors / Indicators: Burning;Sore;Discomfort;Grimacing;Guarding Pain Intervention(s): Monitored during session;Repositioned;Patient requesting pain meds-RN notified;RN gave pain meds during session    Home Living                        Prior Function                 Hand Dominance        Extremity/Trunk Assessment                         Communication      Cognition Arousal/Alertness: Awake/alert Behavior  During Therapy: WFL for tasks assessed/performed Overall Cognitive Status: Within Functional Limits for tasks assessed                      General Comments      Exercises Total Joint Exercises Ankle Circles/Pumps: AROM;Both;10 reps;Seated Quad Sets: AROM;Strengthening;Left;10 reps;Seated Heel Slides: AAROM;Strengthening;Left;10 reps;Seated      Assessment/Plan    PT Assessment    PT Diagnosis     PT Problem List    PT Treatment Interventions     PT Goals (Current goals can be found in the Care  Plan section) Acute Rehab PT Goals Patient Stated Goal: return home today PT Goal Formulation: With patient/family Time For Goal Achievement: 09/04/16 Potential to Achieve Goals: Good    Frequency 7X/week   Barriers to discharge        Co-evaluation               End of Session Equipment Utilized During Treatment: Gait belt Activity Tolerance: Patient limited by pain Patient left: in chair;with call bell/phone within reach Nurse Communication: Mobility status         Time: [id number] PT Time Calculation (min) (ACUTE ONLY): 34 min   Charges:     PT Treatments $Gait Training: 8-22 mins $Therapeutic Exercise: 8-22 mins   PT G CodesClearnce Sorrel Eduard Penkala 08/29/2016, 10:31 AM Sherie Don, PT, DPT [phone]

## 2016-08-29 NOTE — Care Management Note (Signed)
Case Management Note  Patient Details  Name: Tony Ho MRN: [id number] Date of Birth: 1951/05/14  Subjective/Objective: 65 yo M s/p L TKA                   Action/Plan: CM referral to assist with [facility] needs and DME   Expected Discharge Date:    08/30/16              Expected Discharge Plan:  [place]  In-House Referral:     Discharge planning Services  CM Consult  Post Acute Care Choice:  Durable Medical Equipment Choice offered to:  Patient  DME Arranged:  3-N-1, Walker rolling DME Agency:  [place]:  PT [place]:  [facility] (now [facility])  Status of Service:  Completed, signed off  If discussed at [employer] of Stay Meetings, dates discussed:    Additional Comments: met with pt at bedside. Pt plans to return home with the support of his wife. He needs a RW and a 3-in-1 BSC. Referral made to [facility] by the surgeon's office prior to admission. Pt stated that he doesn't have a preference for a [facility] agency and he agreed to use [facility]. Contacted Butch Penny at [facility] to confirm referral and she will f/u. Contacted Reggie at [facility] for DME referral.  Norina Buzzard, RN 08/29/2016, 9:58 AM

## 2016-08-29 NOTE — Progress Notes (Signed)
OT Cancellation Note  Patient Details Name: Tony Ho MRN: [id number] DOB: Sep 12, 1951   Cancelled Treatment:    Reason Eval/Treat Not Completed: Patient at procedure or test/ unavailable;Other (comment) (with PT). Will reattempt as schedule permits.   Hortencia Pilar 08/29/2016, 2:27 PM

## 2016-08-29 NOTE — Progress Notes (Addendum)
     Subjective: 1 Day Post-Op Procedure(s) (LRB): TOTAL KNEE ARTHROPLASTY (Left)   Patient reports pain as moderate-severe at times.  Feels that the medications are only lasting him a couple of hours and then the pain returns.  He didn't sleep well at all last night.  Already working well with PT.  Objective:   VITALS:   Vitals:   08/29/16 0000 08/29/16 0732  BP: (!) 152/58 (!) 141/54  Pulse: 70 63  Resp: 16   Temp: 98.1 F (36.7 C) 97.7 F (36.5 C)    Dorsiflexion/Plantar flexion intact Incision: dressing C/D/I No cellulitis present Compartment soft  LABS  Recent Labs  08/29/16 0011 08/29/16 0355  HGB 12.7* 12.0*  HCT 36.8* 35.6*  WBC 9.4 9.2  PLT 185 205     Recent Labs  08/29/16 0011 08/29/16 0355  NA  --  134*  K  --  3.8  BUN  --  7  CREATININE 0.72 0.70  GLUCOSE  --  133*     Assessment/Plan: 1 Day Post-Op Procedure(s) (LRB): TOTAL KNEE ARTHROPLASTY (Left)   Advance diet Up with therapy D/C IV fluids Discharge home eventually, when ready   [place]. Babish   PAC  08/29/2016, 9:27 AM   Agree with above.  Working on pain control this morning.  PT, OT DVT prophylaxis - Coumadin with Lovenox bridge Possible D/C tomorrow - care management working hard on all of the home health arrangements - thanks!  Dr Veverly Fells

## 2016-08-30 LAB — CBC
HCT: 40 % (ref 39.0–52.0)
HEMOGLOBIN: 13.5 g/dL (ref 13.0–17.0)
MCH: 32.8 pg (ref 26.0–34.0)
MCHC: 33.8 g/dL (ref 30.0–36.0)
MCV: 97.1 fL (ref 78.0–100.0)
Platelets: 225 10*3/uL (ref 150–400)
RBC: 4.12 MIL/uL — AB (ref 4.22–5.81)
RDW: 12.4 % (ref 11.5–15.5)
WBC: 10.9 10*3/uL — ABNORMAL HIGH (ref 4.0–10.5)

## 2016-08-30 LAB — PROTIME-INR
INR: 1.23
PROTHROMBIN TIME: 15.6 s — AB (ref 11.4–15.2)

## 2016-08-30 MED ORDER — WARFARIN SODIUM 7.5 MG PO TABS: 7.5000 mg | ORAL_TABLET | Freq: Once | ORAL | Status: DC

## 2016-08-30 NOTE — Discharge Summary (Signed)
Physician Discharge Summary   Patient ID: Tony Ho MRN: [id number] DOB/AGE: 06/30/1951 65 y.o.  Admit date: 08/28/2016 Discharge date: 08/30/2016  Admission Diagnoses:  Active Problems:   H/O total knee replacement   Discharge Diagnoses:  Same   Surgeries: Procedure(s): TOTAL KNEE ARTHROPLASTY on 08/28/2016   Consultants: PT/OT  Discharged Condition: Stable  Hospital Course: Tony Ho is an 65 y.o. male who was admitted 08/28/2016 with a chief complaint of left knee pain, and found to have a diagnosis of left knee end stage osteoarthritis.  They were brought to the operating room on 08/28/2016 and underwent the above named procedures.    The patient had an uncomplicated hospital course and was stable for discharge.  Recent vital signs:  Vitals:   08/29/16 2015 08/30/16 0438  BP: (!) 148/69 (!) 143/65  Pulse: 91 91  Resp:    Temp: 98.2 F (36.8 C) 98.4 F (36.9 C)    Recent laboratory studies:  Results for orders placed or performed during the hospital encounter of 08/28/16  CBC  Result Value Ref Range   WBC 9.4 4.0 - 10.5 K/uL   RBC 3.83 (L) 4.22 - 5.81 MIL/uL   Hemoglobin 12.7 (L) 13.0 - 17.0 g/dL   HCT 36.8 (L) 39.0 - 52.0 %   MCV 96.1 78.0 - 100.0 fL   MCH 33.2 26.0 - 34.0 pg   MCHC 34.5 30.0 - 36.0 g/dL   RDW 12.4 11.5 - 15.5 %   Platelets 185 150 - 400 K/uL  Creatinine, serum  Result Value Ref Range   Creatinine, Ser 0.72 0.61 - 1.24 mg/dL   GFR calc non Af Amer >60 >60 mL/min   GFR calc Af Amer >60 >60 mL/min  Protime-INR  Result Value Ref Range   Prothrombin Time 14.0 11.4 - 15.2 seconds   INR 1.07   CBC  Result Value Ref Range   WBC 9.2 4.0 - 10.5 K/uL   RBC 3.71 (L) 4.22 - 5.81 MIL/uL   Hemoglobin 12.0 (L) 13.0 - 17.0 g/dL   HCT 35.6 (L) 39.0 - 52.0 %   MCV 96.0 78.0 - 100.0 fL   MCH 32.3 26.0 - 34.0 pg   MCHC 33.7 30.0 - 36.0 g/dL   RDW 12.4 11.5 - 15.5 %   Platelets 205 150 - 400 K/uL  Basic metabolic panel  Result Value Ref  Range   Sodium 134 (L) 135 - 145 mmol/L   Potassium 3.8 3.5 - 5.1 mmol/L   Chloride 101 101 - 111 mmol/L   CO2 24 22 - 32 mmol/L   Glucose, Bld 133 (H) 65 - 99 mg/dL   BUN 7 6 - 20 mg/dL   Creatinine, Ser 0.70 0.61 - 1.24 mg/dL   Calcium 8.3 (L) 8.9 - 10.3 mg/dL   GFR calc non Af Amer >60 >60 mL/min   GFR calc Af Amer >60 >60 mL/min   Anion gap 9 5 - 15  Protime-INR  Result Value Ref Range   Prothrombin Time 13.5 11.4 - 15.2 seconds   INR 1.03   Protime-INR  Result Value Ref Range   Prothrombin Time 15.6 (H) 11.4 - 15.2 seconds   INR 1.23   CBC  Result Value Ref Range   WBC 10.9 (H) 4.0 - 10.5 K/uL   RBC 4.12 (L) 4.22 - 5.81 MIL/uL   Hemoglobin 13.5 13.0 - 17.0 g/dL   HCT 40.0 39.0 - 52.0 %   MCV 97.1 78.0 - 100.0 fL   MCH 32.8  26.0 - 34.0 pg   MCHC 33.8 30.0 - 36.0 g/dL   RDW 12.4 11.5 - 15.5 %   Platelets 225 150 - 400 K/uL    Discharge Medications:   Current Outpatient Prescriptions  Medication Sig Dispense Refill  . aspirin 325 MG tablet Take 325 mg by mouth daily.    . calcium carbonate (OS-CAL) 1250 MG chewable tablet Chew 1 tablet by mouth daily.    . furosemide (LASIX) 80 MG tablet Take 80 mg by mouth.    . levothyroxine (SYNTHROID, LEVOTHROID) 175 MCG tablet Take 175 mcg by mouth daily.  0  . Multiple Vitamin (MULTIVITAMIN WITH MINERALS) TABS tablet Take 1 tablet by mouth daily.    . temazepam (RESTORIL) 15 MG capsule Take 15 mg by mouth at bedtime as needed for sleep.    . traMADol (ULTRAM) 50 MG tablet Take 50 mg by mouth every 6 (six) hours as needed (pain).        Diagnostic Studies: Dg Knee Left Port  Result Date: 08/28/2016 CLINICAL DATA:  65 year old male status post total knee replacement. Initial encounter. EXAM: PORTABLE LEFT KNEE - 1-2 VIEW COMPARISON:  None. FINDINGS: Portable AP and cross-table lateral views at 1534 hours. Sequelae of left total knee arthroplasty. Hardware appears intact and normally aligned. No unexpected osseous  changes. Postoperative gas within the joint, an the surrounding soft tissues. Vascular varicosities are evident in the left lower extremity. IMPRESSION: Left total knee arthroplasty with no adverse features. Electronically Signed   By: Genevie Ann M.D.   On: 08/28/2016 15:42    Disposition: 01-Home or Self Care    Follow-up Information    NORRIS,STEVEN R, MD. Call in 2 week(s).   Specialty:  Orthopedic Surgery Why:  [id number] Contact information: [address] [place] [address] [phone]        [facility] .   Why:  [facility] will follow up with home health physical therapy Contact information: [place] [address] [phone]            Signed: Ventura Bruns 08/30/2016, 8:09 AM

## 2016-08-30 NOTE — Progress Notes (Signed)
Luther Hearing to be D/C'd Home per MD order.  Discussed with the patient and all questions fully answered.  VSS, Skin clean, dry and intact without evidence of skin break down, no evidence of skin tears noted. IV catheter discontinued intact. Site without signs and symptoms of complications. Dressing and pressure applied.  An After Visit Summary was printed and given to the patient. Patient received prescription.  D/c education completed with patient and wife including follow up instructions, medication list, d/c activities limitations if indicated, with other d/c instructions as indicated by MD - patient able to verbalize understanding, all questions fully answered.   Patient instructed to return to ED, call 911, or call MD for any changes in condition.   Patient escorted via [facility], and D/C home via private auto.  Jerry Caras 08/30/2016 11:13 AM

## 2016-08-30 NOTE — Progress Notes (Signed)
   Subjective: 2 Days Post-Op Procedure(s) (LRB): TOTAL KNEE ARTHROPLASTY (Left)  Pt doing very well Ready for d/c home today Pain is moderate but working with therapy very well Has already walked and completed stair training Patient reports pain as moderate.  Objective:   VITALS:   Vitals:   08/29/16 2015 08/30/16 0438  BP: (!) 148/69 (!) 143/65  Pulse: 91 91  Resp:    Temp: 98.2 F (36.8 C) 98.4 F (36.9 C)    Left knee dressing changed Incision healing well No rashes  Mild edema distally  LABS  Recent Labs  08/29/16 0011 08/29/16 0355 08/30/16 0406  HGB 12.7* 12.0* 13.5  HCT 36.8* 35.6* 40.0  WBC 9.4 9.2 10.9*  PLT 185 205 225     Recent Labs  08/29/16 0011 08/29/16 0355  NA  --  134*  K  --  3.8  BUN  --  7  CREATININE 0.72 0.70  GLUCOSE  --  133*     Assessment/Plan: 2 Days Post-Op Procedure(s) (LRB): TOTAL KNEE ARTHROPLASTY (Left) Pt doing very well Plan to d/c home today after therapy F/u in  2 weeks in the office    Brad Vegas Fritze, MPAS, PA-C  08/30/2016, 8:07 AM

## 2016-08-30 NOTE — Progress Notes (Signed)
Orthopedic Tech Progress Note Patient Details:  Tony Ho 1951/07/02 [id number]  Patient ID: Luther Hearing, male   DOB: 04-May-1951, 65 y.o.   MRN: [id number] Came to put pt in cpm. Pt was already out of bed. Pt will call when ready to get in cpm.  Karolee Stamps 08/30/2016, 6:11 AM

## 2016-08-30 NOTE — Progress Notes (Signed)
ANTICOAGULATION CONSULT NOTE - Follow up [place] for warfarin Indication: VTE prophylaxis  Allergies  Allergen Reactions  . No Known Allergies     Patient Measurements: Height: 5\' 8"  (172.7 cm) Weight: 226 lb 8 oz (102.7 kg) IBW/kg (Calculated) : 68.4  Vital Signs: Temp: 98.4 F (36.9 C) (09/10 0438) Temp Source: Oral (09/10 0438) BP: 143/65 (09/10 0438) Pulse Rate: 91 (09/10 0438)  Labs:  Recent Labs  08/29/16 0011 08/29/16 0355 08/30/16 0406  HGB 12.7* 12.0* 13.5  HCT 36.8* 35.6* 40.0  PLT 185 205 225  LABPROT 13.5 14.0 15.6*  INR 1.03 1.07 1.23  CREATININE 0.72 0.70  --     Estimated Creatinine Clearance: 106.9 mL/min (by C-G formula based on SCr of 0.8 mg/dL).   Medical History: Past Medical History:  Diagnosis Date  . Arthritis   . Edema extremities    B/LLE  . Hypothyroidism   . Morbid obesity ([place])   . OSA on CPAP     Assessment: 65yo M s/p L TKA on 9/8. Pharmacy consulted to dose warfarin for VTE prophylaxis post-op. On Lovenox bridge. No anticoagulation PTA. Hgb 12>13.5, Plt wnl, no bleed documented.  INR today subtherapeutic at 1.23  Goal of Therapy:  INR 2-3 Monitor platelets by anticoagulation protocol: Yes   Plan:  -Warfarin 7.5mg  x 1 dose tonight -Continue Lovenox 30mg  [place] q12h (dose per MD) - d/c when INR therapeutic -Daily INR -Monitor CBC, s/sx of bleeding   Gwenlyn Perking, PharmD PGY1 Pharmacy Resident Pager: [phone] 08/30/2016 8:05 AM

## 2016-08-31 ENCOUNTER — Encounter (HOSPITAL_COMMUNITY): Payer: Self-pay | Admitting: Orthopedic Surgery

## 2016-11-09 ENCOUNTER — Encounter (HOSPITAL_COMMUNITY): Payer: Self-pay

## 2017-06-29 ENCOUNTER — Encounter: Payer: Self-pay | Admitting: Neurology

## 2017-06-29 ENCOUNTER — Ambulatory Visit (INDEPENDENT_AMBULATORY_CARE_PROVIDER_SITE_OTHER): Payer: Medicare PPO | Admitting: Neurology

## 2017-06-29 VITALS — BP 150/80 | HR 76 | Ht 68.0 in | Wt 224.0 lb

## 2017-06-29 DIAGNOSIS — E669 Obesity, unspecified: Secondary | ICD-10-CM | POA: Diagnosis not present

## 2017-06-29 DIAGNOSIS — G4733 Obstructive sleep apnea (adult) (pediatric): Secondary | ICD-10-CM

## 2017-06-29 DIAGNOSIS — Z9884 Bariatric surgery status: Secondary | ICD-10-CM

## 2017-06-29 DIAGNOSIS — R351 Nocturia: Secondary | ICD-10-CM

## 2017-06-29 DIAGNOSIS — R600 Localized edema: Secondary | ICD-10-CM

## 2017-06-29 NOTE — Progress Notes (Signed)
Subjective:    Patient ID: Tony Ho is a 66 y.o. male.  HPI     Star Age, MD, PhD [facility] [address], [address]O. [place], [place] [address]  Dear [name],   I saw your patient, Tony Ho, upon your kind request in my neurologic clinic today for initial consultation of his sleep disorder, in particular, concern for underlying obstructive sleep apnea. The patient is unaccompanied today. As you know, Tony Ho is a 66 year old right-handed gentleman with an underlying medical history of lower extremity edema, arthritis with status post knee replacement surgery, hypothyroidism, ED, and obesity with status post gastric bypass surgery in the 90s as he recalls, who reports some snoring and a prior diagnosis of OSA. I reviewed your office note from 06/07/2017. Prior sleep study results are not available for my review today. He is not currently on CPAP, his machine broke. A CPAP download is not available for my review today. He reports that he had sleep study testing about 10 years ago. He did not have reevaluation after that, he still has the original CPAP machine which broke several months ago. When he first started using CPAP he felt improved as to his daytime tiredness and sleep quality. He felt better rested. He would be willing to get reestablished on CPAP if the need arises but reports improved snoring since his weight loss. He lost about 186 pounds. He was down to a 186 pounds at his lowest, gained some weight back but is maintaining in the 220s. He denies morning headache. He has nocturia about once per average night, denies restless leg symptoms or parasomnias. Bedtime is around 10, wakeup time generally between 4:30 and 5. He has no family history of OSA. His Epworth sleepiness score is 6 out of 24 today, fatigue score is 18 out of 63. He is married and lives with his wife. He is a retired Youth worker, he has 3 children, all grown, 14 GC (including  step). He does drink caffeine quite a bit, about 5 to 6 cups of coffee per day, nonsmoker and does not drink alcohol on a regular basis. Has a small dog in the house, sometimes in the bed.   His Past Medical History Is Significant For: Past Medical History:  Diagnosis Date  . Arthritis   . Edema extremities    B/LLE  . Hypothyroidism   . Morbid obesity ([place])   . OSA on CPAP     His Past Surgical History Is Significant For: Past Surgical History:  Procedure Laterality Date  . COLONOSCOPY    . GASTRIC BYPASS  2012  . HERNIA REPAIR    . LIPOMA EXCISION N/A 08/16/2014   Procedure: EXCISION OF FOREHEAD LIPOMA AND CYST RIGHT CHEEK;  Surgeon: Imogene Burn. Georgette Dover, MD;  Location: [facility];  Service: General;  Laterality: N/A;  . ROTATOR CUFF REPAIR     rt side 30 yrs  . TOTAL KNEE ARTHROPLASTY Left 08/28/2016   Procedure: TOTAL KNEE ARTHROPLASTY;  Surgeon: Netta Cedars, MD;  Location: [facility];  Service: Orthopedics;  Laterality: Left;    Her Family History Is Significant For: Family History  Problem Relation Age of Onset  . Diabetes Mother   . Heart disease Mother     His Social History Is Significant For: Social History   Social History  . Marital status: Married    Spouse name: Pape Parson  . Number of children: 3  . Years of education: N/A   Occupational History  .  machinist    Social History Main Topics  . Smoking status: Former Research scientist (life sciences)  . Smokeless tobacco: Former Systems developer    Types: Chew     Comment: quit in 1990's  . Alcohol use No  . Drug use: No  . Sexual activity: Not Asked   Other Topics Concern  . None   Social History Narrative  . None    His Allergies Are:  Allergies  Allergen Reactions  . No Known Allergies   :   His Current Medications Are:  Outpatient Encounter Prescriptions as of 06/29/2017  Medication Sig  . aspirin 325 MG tablet Take 325 mg by mouth daily.  . calcium carbonate (OS-CAL) 1250 MG chewable tablet Chew 1 tablet by mouth daily.  . calcium  citrate-vitamin D (CITRACAL+D) 315-200 MG-UNIT tablet Take 1 tablet by mouth 2 (two) times daily.  . furosemide (LASIX) 80 MG tablet Take 80 mg by mouth.  . levothyroxine (SYNTHROID, LEVOTHROID) 175 MCG tablet Take 175 mcg by mouth daily.  . methocarbamol (ROBAXIN) 500 MG tablet Take 1 tablet (500 mg total) by mouth 3 (three) times daily as needed.  . Multiple Vitamin (MULTIVITAMIN WITH MINERALS) TABS tablet Take 1 tablet by mouth daily.  . temazepam (RESTORIL) 15 MG capsule Take 15 mg by mouth at bedtime as needed for sleep.  . traMADol (ULTRAM) 50 MG tablet Take 50 mg by mouth every 6 (six) hours as needed (pain).   Marland Kitchen warfarin (COUMADIN) 5 MG tablet Take 1 tablet (5 mg total) by mouth daily. Take as directed per the pharmacist for INR target from 2.5-3.0 for 30 days post op  . [DISCONTINUED] oxyCODONE-acetaminophen (ROXICET) 5-325 MG tablet Take 1-2 tablets by mouth every 4 (four) hours as needed for severe pain.   No facility-administered encounter medications on file as of 06/29/2017.   :  Review of Systems:  Out of a complete 14 point review of systems, all are reviewed and negative with the exception of these symptoms as listed below: Review of Systems  Neurological:       Pt presents today to discuss getting a new cpap. Pt has used a cpap for many years and his current cpap is broken. Pt does not have a local DME.  Epworth Sleepiness Scale 0= would never doze 1= slight chance of dozing 2= moderate chance of dozing 3= high chance of dozing  Sitting and reading: 1 Watching TV: 2 Sitting inactive in a public place (ex. Theater or meeting): 0 As a passenger in a car for an hour without a break: 0 Lying down to rest in the afternoon: 2 Sitting and talking to someone: 0 Sitting quietly after lunch (no alcohol): 1 In a car, while stopped in traffic: 0 Total: 6     Objective:  Neurological Exam  Physical Exam Physical Examination:   Vitals:   06/29/17 1542  BP: (!) 150/80   Pulse: 76   General Examination: The patient is a very pleasant 66 y.o. male in no acute distress. He appears well-developed and well-nourished and well groomed.   HEENT: Normocephalic, atraumatic, pupils are equal, round and reactive to light and accommodation. Extraocular tracking is good without limitation to gaze excursion or nystagmus noted. Normal smooth pursuit is noted. Hearing is grossly intact. Face is symmetric with normal facial animation and normal facial sensation. Speech is clear with no dysarthria noted. There is no hypophonia. There is no lip, neck/head, jaw or voice tremor. Neck is supple with full range of passive and active motion.  There are no carotid bruits on auscultation. Oropharynx exam reveals: mild mouth dryness, stitches in the lower gum area, is getting implants eventually, has full dentures on top. Overall, mild airway crowding secondary to smaller airway entry and redundant soft palate, wider appearing uvula but not swollen. Tonsils are small or even absent? He reports no tonsillectomy. Neck circumference is 15-1/2 inches. Tongue protrudes centrally and palate elevates symmetrically.    Chest: Clear to auscultation without wheezing, rhonchi or crackles noted.  Heart: S1+S2+0, regular and normal without murmurs, rubs or gallops noted.   Abdomen: Soft, non-tender and non-distended with normal bowel sounds appreciated on auscultation.  Extremities: There is 1+ pitting edema in the distal lower extremities bilaterally. Wearing compression socks up to knees.   Skin: Warm and dry without trophic changes noted.  Musculoskeletal: exam reveals no obvious joint deformities, tenderness or joint swelling or erythema.   Neurologically:  Mental status: The patient is awake, alert and oriented in all 4 spheres. His immediate and remote memory, attention, language skills and fund of knowledge are appropriate. There is no evidence of aphasia, agnosia, apraxia or anomia. Speech is  clear with normal prosody and enunciation. Thought process is linear. Mood is normal and affect is normal.  Cranial nerves II - XII are as described above under HEENT exam. In addition: shoulder shrug is normal with equal shoulder height noted. Motor exam: Normal bulk, strength and tone is noted. There is no drift, tremor or rebound. Romberg is negative. Reflexes are 1+ in the UEs and trace in the LEs. Fine motor skills and coordination: intact with normal finger taps, normal hand movements, normal rapid alternating patting, normal foot taps and normal foot agility.  Cerebellar testing: No dysmetria or intention tremor on finger to nose testing. Heel to shin is unremarkable bilaterally. There is no truncal or gait ataxia.  Sensory exam: intact to light touch in the upper and lower extremities.  Gait, station and balance: He stands easily. No veering to one side is noted. No leaning to one side is noted. Posture is age-appropriate and stance is narrow based. Gait shows normal stride length and normal pace. No problems turning are noted. Tandem walk is unremarkable.   Assessment and plan:  In summary, Tony Ho is a very pleasant 66 y.o.-year old male with an underlying medical history of lower extremity edema, arthritis with status post knee replacement surgery, hypothyroidism, ED, and obesity with status post gastric bypass surgery in the 90s as he recalls, who was previously Diagnosed with obstructive sleep apnea in place on CPAP therapy. He was compliant with CPAP per his report but his machine broke several months ago. He has lost a lot of weight since his gastric bypass surgery and has been able to maintain it for the most part recently. Nevertheless, he would benefit from reevaluation of his OSA and reestablishing CPAP therapy if the need arises.  I had a long chat with the patient about my findings and the diagnosis of OSA, its prognosis and treatment options. We talked about medical treatments,  surgical interventions and non-pharmacological approaches. I explained in particular the risks and ramifications of untreated moderate to severe OSA, especially with respect to developing cardiovascular disease down the Road, including congestive heart failure, difficult to treat hypertension, cardiac arrhythmias, or stroke. Even type 2 diabetes has, in part, been linked to untreated OSA. Symptoms of untreated OSA include daytime sleepiness, memory problems, mood irritability and mood disorder such as depression and anxiety, lack of energy, as well  as recurrent headaches, especially morning headaches. We talked about trying to maintain a healthy lifestyle in general, as well as the importance of weight control. I encouraged the patient to eat healthy, exercise daily and keep well hydrated, to keep a scheduled bedtime and wake time routine, to not skip any meals and eat healthy snacks in between meals. I advised the patient not to drive when feeling sleepy. I recommended the following at this time: sleep study with potential positive airway pressure titration. (We will score hypopneas at 4%).   I explained the sleep test procedure to the patient and also outlined possible surgical and non-surgical treatment options of OSA, including the use of a custom-made dental device (which would require a referral to a specialist dentist or oral surgeon), upper airway surgical options, such as pillar implants, radiofrequency surgery, tongue base surgery, and UPPP (which would involve a referral to an ENT surgeon). Rarely, jaw surgery such as mandibular advancement may be considered.  I also explained the CPAP treatment option to the patient, who indicated that he would be willing to try CPAP if the need arises. I explained the importance of being compliant with PAP treatment, not only for insurance purposes but primarily to improve His symptoms, and for the patient's long term health benefit, including to reduce His  cardiovascular risks. I answered all his questions today and the patient was in agreement. I would like to see him back after the sleep study is completed and encouraged him to call with any interim questions, concerns, problems or updates.   Thank you very much for allowing me to participate in the care of this nice patient. If I can be of any further assistance to you please do not hesitate to call me at [phone].  Sincerely,   Star Age, MD, PhD

## 2017-06-29 NOTE — Patient Instructions (Signed)
Based on your symptoms and your exam I believe you may still be at some risk for obstructive sleep apnea or OSA, and I think we should proceed with a sleep study to determine whether you do or do not have OSA and how severe it is. If you have more than mild OSA, I want you to consider treatment with CPAP. Please remember, the risks and ramifications of moderate to severe obstructive sleep apnea or OSA are: Cardiovascular disease, including congestive heart failure, stroke, difficult to control hypertension, arrhythmias, and even type 2 diabetes has been linked to untreated OSA. Sleep apnea causes disruption of sleep and sleep deprivation in most cases, which, in turn, can cause recurrent headaches, problems with memory, mood, concentration, focus, and vigilance. Most people with untreated sleep apnea report excessive daytime sleepiness, which can affect their ability to drive. Please do not drive if you feel sleepy.   I will likely see you back after your sleep study to go over the test results and where to go from there. We will call you after your sleep study to advise about the results (most likely, you will hear from Beverlee Nims, my nurse) and to set up an appointment at the time, as necessary.    Our sleep lab administrative assistant, Arrie Aran will meet with you or call you to schedule your sleep study. If you don't hear back from her by next week please feel free to call her at [phone]. This is her direct line and please leave a message with your phone number to call back if you get the voicemail box. She will call back as soon as possible.

## 2017-07-13 ENCOUNTER — Encounter (HOSPITAL_COMMUNITY): Payer: Self-pay

## 2017-07-15 ENCOUNTER — Telehealth: Payer: Self-pay | Admitting: Neurology

## 2017-07-15 ENCOUNTER — Other Ambulatory Visit: Payer: Self-pay | Admitting: Neurology

## 2017-07-15 DIAGNOSIS — G4730 Sleep apnea, unspecified: Secondary | ICD-10-CM

## 2017-07-15 NOTE — Telephone Encounter (Signed)
[employer] denied split but approved HST.  Can I get an order for HST?

## 2017-07-15 NOTE — Telephone Encounter (Signed)
Order is placed.

## 2017-07-28 ENCOUNTER — Ambulatory Visit (INDEPENDENT_AMBULATORY_CARE_PROVIDER_SITE_OTHER): Payer: Medicare PPO | Admitting: Neurology

## 2017-07-28 DIAGNOSIS — G4733 Obstructive sleep apnea (adult) (pediatric): Secondary | ICD-10-CM

## 2017-08-09 ENCOUNTER — Telehealth: Payer: Self-pay | Admitting: Neurology

## 2017-08-09 DIAGNOSIS — G4733 Obstructive sleep apnea (adult) (pediatric): Secondary | ICD-10-CM

## 2017-08-09 NOTE — Telephone Encounter (Signed)
LMTC./fim 

## 2017-08-09 NOTE — Telephone Encounter (Signed)
Patient referred by Roe Coombs, PA, seen by me on 06/29/17, HST on 07/28/17:  Please call and notify the patient that the recent home sleep test did suggest the diagnosis of moderate obstructive sleep apnea and that I recommend treatment for this in the form of CPAP. I will request an overnight sleep study for proper titration and mask fitting. Please explain to patient and arrange for a CPAP titration study. I have placed an order in the chart. Thanks, and please route to [facility] for scheduling.   Star Age, MD, PhD [facility])

## 2017-08-09 NOTE — Procedures (Signed)
  [facility] @[facility] [place], [place] [address] NAME: Tony Ho DOB: [birth year] MEDICAL RECORD [id number] DOS: 07/28/17 REFERRING PHYSICIAN: Roe Coombs, PA-C STUDY PERFORMED: HST HISTORY: 66 year old man with a history of lower extremity edema, arthritis, s/p knee replacement surgery, hypothyroidism, ED, and obesity, s/p gastric bypass surgery in the 90s, who reports snoring and a prior diagnosis of OSA. Epworth sleepiness score is 6 out of 24, BMI of 34.   STUDY RESULTS: Total Recording Time: 8h 24m Total Apnea/Hypopnea Index (AHI): 22.6/hour Average Oxygen Saturation: 93% Lowest Oxygen Saturation: 80%, Time below 88% saturation: 6 min. Average Heart Rate: 63 bpm IMPRESSION: OSA RECOMMENDATION: This home sleep test demonstrates moderate obstructive sleep apnea with a total AHI of 22.6/hour and O2 nadir of 80%. Given the patient's medical history and sleep related complaints, treatment with positive airway pressure (in the form of CPAP) is recommended. This will require a full night CPAP titration study for proper treatment settings and mask fitting. Please note that untreated obstructive sleep apnea carries additional perioperative morbidity. Patients with significant obstructive sleep apnea should receive perioperative PAP therapy and the surgeons and particularly the anesthesiologist should be informed of the diagnosis and the severity of the sleep disordered breathing. The patient should be cautioned not to drive, work at heights, or operate dangerous or heavy equipment when tired or sleepy. Review and reiteration of good sleep hygiene measures should be pursued with any patient. The patient and his referring provider will be notified of the test results. The patient will be seen in follow up in sleep clinic at [facility].  I certify that I have reviewed the raw data recording prior to the issuance of this report in accordance with the standards  of the American Academy of Sleep Medicine (AASM). Star Age, MD, PhD [facility]) Diplomat, ABPN (neurology and sleep)

## 2017-08-09 NOTE — Telephone Encounter (Signed)
Pt called the clinic for sleep results. He can be reached at [phone]

## 2017-08-10 ENCOUNTER — Telehealth: Payer: Self-pay

## 2017-08-10 ENCOUNTER — Telehealth: Payer: Self-pay | Admitting: Neurology

## 2017-08-10 DIAGNOSIS — G4733 Obstructive sleep apnea (adult) (pediatric): Secondary | ICD-10-CM

## 2017-08-10 NOTE — Telephone Encounter (Signed)
Pt returned call to discuss sleep study results. The insurance has denied for him to proceed forward with titration study. Pt agrees to starting auto cpap if that is what Dr Rexene Alberts suggest. If so, I will send the order to [facility]. A Follow up apt was made for November 1st 2018 at 8:30 am.pt verbalized understanding and had not further quesions

## 2017-08-10 NOTE — Telephone Encounter (Signed)
[employer] denied CPAP suggest autopap

## 2017-08-10 NOTE — Telephone Encounter (Signed)
Lm for patient to return call to go over sleep study results and schedule follow-up appointment. An order would be sent to a DME company and he would hear from them.

## 2017-08-10 NOTE — Telephone Encounter (Signed)
Called patient to discuss sleep study results. No answer. LVM for pt to return call.  

## 2017-08-11 NOTE — Addendum Note (Signed)
Addended by: Star Age on: 08/11/2017 12:57 PM   Modules accepted: Orders

## 2017-08-11 NOTE — Telephone Encounter (Signed)
We will set patient up with autoPAP at home, as insurance denied in house titration study for OSA. Pls process order and notify patient and set up FU in 10 weeks with me or NP.     

## 2017-08-18 NOTE — Telephone Encounter (Signed)
I called pt, he is already aware of sleep study results, starting a cpap, compliance expectations, and a follow up in November. I will send order to [facility]. Pt verbalized understanding and appreciation.

## 2017-10-17 ENCOUNTER — Encounter: Payer: Self-pay | Admitting: Neurology

## 2017-10-21 ENCOUNTER — Ambulatory Visit (INDEPENDENT_AMBULATORY_CARE_PROVIDER_SITE_OTHER): Payer: Medicare PPO | Admitting: Neurology

## 2017-10-21 ENCOUNTER — Encounter: Payer: Self-pay | Admitting: Neurology

## 2017-10-21 VITALS — BP 137/77 | HR 76 | Ht 67.0 in | Wt 240.0 lb

## 2017-10-21 DIAGNOSIS — G4733 Obstructive sleep apnea (adult) (pediatric): Secondary | ICD-10-CM

## 2017-10-21 DIAGNOSIS — Z9989 Dependence on other enabling machines and devices: Secondary | ICD-10-CM | POA: Diagnosis not present

## 2017-10-21 NOTE — Progress Notes (Signed)
Subjective:    Patient ID: Tony Ho is a 66 y.o. male.  HPI    Interim history:   Tony Ho is a 66 year old right-handed gentleman with an underlying medical history of lower extremity edema, arthritis with status post knee replacement surgery, hypothyroidism, ED, and obesity with status post gastric bypass surgery in the 90s as he recalls, who presents for follow-up consultation of his obstructive sleep apnea after recent sleep testing and starting AutoPap therapy at home. The patient is unaccompanied today. I first met him on 06/29/2017 at the request of his primary care physician, at which time the patient reported a prior diagnosis of obstructive sleep apnea and using CPAP but his machine broke and he had not had reevaluation in probably 10 years. I suggested we proceed with sleep study testing. His insurance denied and attentive sleep study. He had a home sleep test on 07/28/2017 indicating moderate obstructive sleep apnea with an AHI of 22.6 per hour, O2 nadir of 80%. His insurance denied a CPAP titration study. I started him on AutoPap therapy at home.  Today, 10/21/2017: I reviewed his AutoPap compliance data from 09/18/2017 through 10/17/2017, which is a total of 30 days, during which time he used his machine 29 days with percent used days greater than 4 hours at 93%, indicating excellent compliance with an average usage of 7 hours and 5 minutes, residual AHI 2.6 per hour, 95th percentile pressure at 13.8 cm, leak low with the 95th percentile at 7.5 L/m, pressure setting of 5-15 cm with EPR. He reports doing better, likes the auto setting, using FFM with success. He has no new issues, other than L knee pain, may have twisted it. Does not get flu shot, needs shingles and pneumonia shot. Feels better rested, no significant EDS.   The patient's allergies, current medications, family history, past medical history, past social history, past surgical history and problem list were reviewed  and updated as appropriate.   Previously (copied from previous notes for reference):   06/29/2017: (He) reports some snoring and a prior diagnosis of OSA. I reviewed your office note from 06/07/2017. Prior sleep study results are not available for my review today. He is not currently on CPAP, his machine broke. A CPAP download is not available for my review today. He reports that he had sleep study testing about 10 years ago. He did not have reevaluation after that, he still has the original CPAP machine which broke several months ago. When he first started using CPAP he felt improved as to his daytime tiredness and sleep quality. He felt better rested. He would be willing to get reestablished on CPAP if the need arises but reports improved snoring since his weight loss. He lost about 186 pounds. He was down to a 186 pounds at his lowest, gained some weight back but is maintaining in the 220s. He denies morning headache. He has nocturia about once per average night, denies restless leg symptoms or parasomnias. Bedtime is around 10, wakeup time generally between 4:30 and 5. He has no family history of OSA. His Epworth sleepiness score is 6 out of 24 today, fatigue score is 18 out of 63. He is married and lives with his wife. He is a retired Youth worker, he has 3 children, all grown, 14 GC (including step). He does drink caffeine quite a bit, about 5 to 6 cups of coffee per day, nonsmoker and does not drink alcohol on a regular basis. Has a small dog in the house, sometimes  in the bed.    His Past Medical History Is Significant For: Past Medical History:  Diagnosis Date  . Arthritis   . Edema extremities    B/LLE  . Hypothyroidism   . Morbid obesity ([place])   . OSA on CPAP     His Past Surgical History Is Significant For: Past Surgical History:  Procedure Laterality Date  . COLONOSCOPY    . GASTRIC BYPASS  2012  . HERNIA REPAIR    . LIPOMA EXCISION N/A 08/16/2014   Procedure: EXCISION OF FOREHEAD  LIPOMA AND CYST RIGHT CHEEK;  Surgeon: Imogene Burn. Georgette Dover, MD;  Location: [facility];  Service: General;  Laterality: N/A;  . ROTATOR CUFF REPAIR     rt side 30 yrs  . TOTAL KNEE ARTHROPLASTY Left 08/28/2016   Procedure: TOTAL KNEE ARTHROPLASTY;  Surgeon: Netta Cedars, MD;  Location: [facility];  Service: Orthopedics;  Laterality: Left;    His Family History Is Significant For: Family History  Problem Relation Age of Onset  . Diabetes Mother   . Heart disease Mother     His Social History Is Significant For: Social History   Social History  . Marital status: Married    Spouse name: Braelen Sproule  . Number of children: 3  . Years of education: N/A   Occupational History  . machinist    Social History Main Topics  . Smoking status: Former Research scientist (life sciences)  . Smokeless tobacco: Former Systems developer    Types: Chew     Comment: quit in 1990's  . Alcohol use No  . Drug use: No  . Sexual activity: Not Asked   Other Topics Concern  . None   Social History Narrative  . None    His Allergies Are:  Allergies  Allergen Reactions  . No Known Allergies   :   His Current Medications Are:  Outpatient Encounter Prescriptions as of 10/21/2017  Medication Sig  . aspirin 325 MG tablet Take 325 mg by mouth daily.  . calcium carbonate (OS-CAL) 1250 MG chewable tablet Chew 1 tablet by mouth daily.  . calcium citrate-vitamin D (CITRACAL+D) 315-200 MG-UNIT tablet Take 1 tablet by mouth 2 (two) times daily.  . chlorhexidine (PERIDEX) 0.12 % solution RINSE WITH 1 CAPFUL FOR 30 SECONDS THE SPIT. USE TWICE A DAY  . furosemide (LASIX) 80 MG tablet Take 80 mg by mouth.  . levothyroxine (SYNTHROID, LEVOTHROID) 175 MCG tablet Take 175 mcg by mouth daily.  Marland Kitchen levothyroxine (SYNTHROID, LEVOTHROID) 175 MCG tablet TAKE 1 TABLET EVERY DAY  . methocarbamol (ROBAXIN) 500 MG tablet Take 1 tablet (500 mg total) by mouth 3 (three) times daily as needed.  . Multiple Vitamin (MULTIVITAMIN WITH MINERALS) TABS tablet Take 1 tablet by mouth  daily.  . potassium chloride (KLOR-CON M10) 10 MEQ tablet TAKE 1 TABLET BY MOUTH EVERY DAY  . temazepam (RESTORIL) 15 MG capsule Take 15 mg by mouth at bedtime as needed for sleep.  . traMADol (ULTRAM) 50 MG tablet Take 50 mg by mouth every 6 (six) hours as needed (pain).   Marland Kitchen warfarin (COUMADIN) 5 MG tablet Take 1 tablet (5 mg total) by mouth daily. Take as directed per the pharmacist for INR target from 2.5-3.0 for 30 days post op  . [DISCONTINUED] furosemide (LASIX) 80 MG tablet TAKE 1 TABLET BY MOUTH EVERY DAY   No facility-administered encounter medications on file as of 10/21/2017.   :  Review of Systems:  Out of a complete 14 point review of systems, all  are reviewed and negative with the exception of these symptoms as listed below:  Review of Systems  Musculoskeletal:       Joint pain   Objective:  Neurological Exam  Physical Exam Physical Examination:   Vitals:   10/21/17 0805  BP: 137/77  Pulse: 76   General Examination: The patient is a very pleasant 66 y.o. male in no acute distress. He appears well-developed and well-nourished and well groomed.   HEENT: Normocephalic, atraumatic, pupils are equal, round and reactive to light and accommodation. Extraocular tracking is good without limitation to gaze excursion or nystagmus noted. Normal smooth pursuit is noted. Hearing is grossly intact. Face is symmetric with normal facial animation and normal facial sensation. Speech is clear with no dysarthria noted. There is no hypophonia. There is no lip, neck/head, jaw or voice tremor. Neck is supple with full range of passive and active motion. There are no carotid bruits on auscultation. Oropharynx exam reveals: mild mouth dryness, stitches in the lower gum area, is getting implants eventually, has full dentures on top. Overall, mild airway crowding secondary to smaller airway entry and redundant soft palate, wider appearing uvula but not swollen. Tonsils are small or even absent? He  reports no tonsillectomy. Neck circumference is 15-1/2 inches. Tongue protrudes centrally and palate elevates symmetrically.    Chest: Clear to auscultation without wheezing, rhonchi or crackles noted.  Heart: S1+S2+0, regular and normal without murmurs, rubs or gallops noted.   Abdomen: Soft, non-tender and non-distended with normal bowel sounds appreciated on auscultation.  Extremities: There is 1 to 2 + pitting edema in the distal lower extremities bilaterally. Wearing compression socks up to knees.   Skin: Warm and dry without trophic changes noted.  Musculoskeletal: exam reveals no obvious joint deformities, tenderness or joint swelling or erythema, with the exception of left knee pain noted. He has possibly mild swelling and mild warmth is noted.   Neurologically:  Mental status: The patient is awake, alert and oriented in all 4 spheres. His immediate and remote memory, attention, language skills and fund of knowledge are appropriate. There is no evidence of aphasia, agnosia, apraxia or anomia. Speech is clear with normal prosody and enunciation. Thought process is linear. Mood is normal and affect is normal.  Cranial nerves II - XII are as described above under HEENT exam. In addition: shoulder shrug is normal with equal shoulder height noted. Motor exam: Normal bulk, strength and tone is noted. There is no drift, tremor or rebound. Romberg is not tested d/t to knee pain and limp. Reflexes are 1+ in the UEs and trace in the LEs. Fine motor skills and coordination: intact with normal finger taps, normal hand movements, normal rapid alternating patting, normal foot taps and normal foot agility.  Cerebellar testing: No dysmetria or intention tremor on finger to nose testing. Heel to shin is not tested today. There is no truncal or gait ataxia.  Sensory exam: intact to light touch in the upper and lower extremities.  Gait, station and balance: He stands easily. No veering to one side is  noted. No leaning to one side is noted. Posture is age-appropriate and stance is narrow based. Gait shows normal stride length but slower pace and mild limp on the L. No problems turning are noted. Tandem walk is not tested today, d/t knee pain.   Assessment and plan:  In summary, Jacquel Mccamish is a very pleasant 66 year old male with an underlying medical history of lower extremity edema, arthritis with status  post knee replacement surgery, hypothyroidism, ED, and obesity with status post gastric bypass surgery in the 90s, who was previously Diagnosed with obstructive sleep apnea and placed on CPAP therapy. He was compliant with CPAP per his report but his machine broke several months ago. He since then had a home sleep test on 07/28/2017 indicating moderate obstructive sleep apnea. He has successfully started AutoPap therapy with a range of 5-15 cm, very good compliance and good results reported. He feels that he can tolerate the setting better than his prior settings, he tolerates a full facemask, leak is low, apnea score at goal. We mutually agreed to continue with the current settings. He is commended for his treatment adherence. I suggested a 6 month follow-up, sooner as needed. He can see one of our nurse practitioners next time. For his left knee pain he is advised to try cold pack and Advil. If he should get worse he needs to get it checked out. He is encouraged to get his pneumonia and shingles vaccines. has lost a he is also advised to continue to pursue healthy lifestyle, stay active mentally and physically, stay well hydrated and well nourished. I answered all his questions today and he was in agreement.  I spent 25 minutes in total face-to-face time with the patient, more than 50% of which was spent in counseling and coordination of care, reviewing test results, reviewing medication and discussing or reviewing the diagnosis of OSA, its prognosis and treatment options. Pertinent laboratory and  imaging test results that were available during this visit with the patient were reviewed by me and considered in my medical decision making (see chart for details).

## 2017-10-21 NOTE — Patient Instructions (Signed)
Keep up the good work! We will see you back in 6 months for sleep apnea check up, and if you continue to do well on CPAP I will see you once a year thereafter.   Please continue using your CPAP regularly. While your insurance requires that you use CPAP at least 4 hours each night on 70% of the nights, I recommend, that you not skip any nights and use it throughout the night if you can. Getting used to CPAP and staying with the treatment long term does take time and patience and discipline. Untreated obstructive sleep apnea when it is moderate to severe can have an adverse impact on cardiovascular health and raise her risk for heart disease, arrhythmias, hypertension, congestive heart failure, stroke and diabetes. Untreated obstructive sleep apnea causes sleep disruption, nonrestorative sleep, and sleep deprivation. This can have an impact on your day to day functioning and cause daytime sleepiness and impairment of cognitive function, memory loss, mood disturbance, and problems focussing. Using CPAP regularly can improve these symptoms.

## 2018-04-25 ENCOUNTER — Ambulatory Visit: Payer: Medicare PPO | Admitting: Adult Health

## 2020-11-06 ENCOUNTER — Telehealth (HOSPITAL_COMMUNITY): Payer: Self-pay

## 2020-11-06 NOTE — Telephone Encounter (Signed)
Called and spoke with pt in regards to CR, pt stated he is not interested in CR at this time.  Closed referral
